# Patient Record
Sex: Female | Born: 1990
Health system: Southern US, Community
[De-identification: ages and names within clinical notes are randomized; demographics above are authoritative.]

## PROBLEM LIST (undated history)

## (undated) DIAGNOSIS — M25569 Pain in unspecified knee: Secondary | ICD-10-CM

## (undated) DIAGNOSIS — F419 Anxiety disorder, unspecified: Secondary | ICD-10-CM

## (undated) DIAGNOSIS — T8859XA Other complications of anesthesia, initial encounter: Secondary | ICD-10-CM

## (undated) DIAGNOSIS — R519 Headache, unspecified: Secondary | ICD-10-CM

## (undated) DIAGNOSIS — R51 Headache: Secondary | ICD-10-CM

## (undated) DIAGNOSIS — C73 Malignant neoplasm of thyroid gland: Secondary | ICD-10-CM

## (undated) DIAGNOSIS — K219 Gastro-esophageal reflux disease without esophagitis: Secondary | ICD-10-CM

## (undated) DIAGNOSIS — Z9889 Other specified postprocedural states: Secondary | ICD-10-CM

## (undated) DIAGNOSIS — G479 Sleep disorder, unspecified: Secondary | ICD-10-CM

## (undated) DIAGNOSIS — F988 Other specified behavioral and emotional disorders with onset usually occurring in childhood and adolescence: Secondary | ICD-10-CM

## (undated) DIAGNOSIS — R112 Nausea with vomiting, unspecified: Secondary | ICD-10-CM

## (undated) DIAGNOSIS — F32A Depression, unspecified: Secondary | ICD-10-CM

## (undated) HISTORY — DX: Other complications of anesthesia, initial encounter: T88.59XA

## (undated) HISTORY — DX: Other specified postprocedural states: Z98.890

## (undated) HISTORY — DX: Nausea with vomiting, unspecified: R11.2

---
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
## 2015-02-25 ENCOUNTER — Other Ambulatory Visit: Payer: Self-pay | Admitting: Physician Assistant

## 2015-02-25 DIAGNOSIS — R221 Localized swelling, mass and lump, neck: Secondary | ICD-10-CM

## 2015-03-08 ENCOUNTER — Ambulatory Visit
Admission: RE | Admit: 2015-03-08 | Discharge: 2015-03-08 | Disposition: A | Discharge status: Home / Self Care | Payer: 59 | Source: Ambulatory Visit | Attending: Physician Assistant | Admitting: Physician Assistant

## 2015-03-08 DIAGNOSIS — R221 Localized swelling, mass and lump, neck: Secondary | ICD-10-CM

## 2015-03-17 ENCOUNTER — Other Ambulatory Visit: Payer: Self-pay | Admitting: Physician Assistant

## 2015-03-17 DIAGNOSIS — E041 Nontoxic single thyroid nodule: Secondary | ICD-10-CM

## 2015-05-06 ENCOUNTER — Other Ambulatory Visit (HOSPITAL_COMMUNITY)
Admission: RE | Admit: 2015-05-06 | Discharge: 2015-05-06 | Disposition: A | Discharge status: Home / Self Care | Payer: 59 | Source: Ambulatory Visit | Attending: Radiology | Admitting: Radiology

## 2015-05-06 ENCOUNTER — Ambulatory Visit
Admission: RE | Admit: 2015-05-06 | Discharge: 2015-05-06 | Disposition: A | Discharge status: Home / Self Care | Payer: 59 | Source: Ambulatory Visit | Attending: Physician Assistant | Admitting: Physician Assistant

## 2015-05-06 DIAGNOSIS — E041 Nontoxic single thyroid nodule: Secondary | ICD-10-CM

## 2015-05-06 NOTE — Procedures (Signed)
Successful [place] FNA biopsy of right thyroid nodule No complications. See PACS for full report.  Ascencion Dike PA-C Interventional Radiology 05/06/2015 11:56 AM

## 2015-05-18 ENCOUNTER — Ambulatory Visit: Payer: Self-pay | Admitting: Surgery

## 2015-07-13 NOTE — Patient Instructions (Addendum)
YOUR PROCEDURE IS SCHEDULED ON :  07/19/14  REPORT TO [place] MAIN ENTRANCE FOLLOW SIGNS TO EAST ELEVATOR - GO TO 3rd FLOOR CHECK IN AT 3 EAST NURSES STATION (SHORT STAY) AT: 5:30 AM  CALL THIS NUMBER IF YOU HAVE PROBLEMS THE MORNING OF SURGERY [phone]  REMEMBER:ONLY 1 PER PERSON MAY GO TO SHORT STAY WITH YOU TO GET READY THE MORNING OF YOUR SURGERY  DO NOT EAT FOOD OR DRINK LIQUIDS AFTER MIDNIGHT  TAKE THESE MEDICINES THE MORNING OF SURGERY: NONE  STOP ASPIRIN / IBUPROFEN / ALEVE / VITAMINS / HERBAL MEDS __5__ DAYS BEFORE SURGERY  YOU MAY NOT HAVE ANY METAL ON YOUR BODY INCLUDING HAIR PINS AND PIERCING'S. DO NOT WEAR JEWELRY, MAKEUP, LOTIONS, POWDERS OR PERFUMES. DO NOT WEAR NAIL POLISH. DO NOT SHAVE 48 HRS PRIOR TO SURGERY. MEN MAY SHAVE FACE AND NECK.  DO NOT [place]. [place] IS NOT RESPONSIBLE FOR VALUABLES.  CONTACTS, DENTURES OR PARTIALS MAY NOT BE WORN TO SURGERY. LEAVE SUITCASE IN CAR. CAN BE BROUGHT TO ROOM AFTER SURGERY.  PATIENTS DISCHARGED THE DAY OF SURGERY WILL NOT BE ALLOWED TO DRIVE HOME.  PLEASE READ OVER THE FOLLOWING INSTRUCTION SHEETS _________________________________________________________________________________                                          [place] - PREPARING FOR SURGERY  Before surgery, you can play an important role.  Because skin is not sterile, your skin needs to be as free of germs as possible.  You can reduce the number of germs on your skin by washing with CHG (chlorahexidine gluconate) soap before surgery.  CHG is an antiseptic cleaner which kills germs and bonds with the skin to continue killing germs even after washing. Please DO NOT use if you have an allergy to CHG or antibacterial soaps.  If your skin becomes reddened/irritated stop using the CHG and inform your nurse when you arrive at Short Stay. Do not shave (including legs and underarms) for at least 48 hours prior to the  first CHG shower.  You may shave your face. Please follow these instructions carefully:   1.  Shower with CHG Soap the night before surgery and the  morning of Surgery.   2.  If you choose to wash your hair, wash your hair first as usual with your  normal  Shampoo.   3.  After you shampoo, rinse your hair and body thoroughly to remove the  shampoo.                                         4.  Use CHG as you would any other liquid soap.  You can apply chg directly  to the skin and wash . Gently wash with scrungie or clean wascloth    5.  Apply the CHG Soap to your body ONLY FROM THE NECK DOWN.   Do not use on open                           Wound or open sores. Avoid contact with eyes, ears mouth and genitals (private parts).  Genitals (private parts) with your normal soap.              6.  Wash thoroughly, paying special attention to the area where your surgery  will be performed.   7.  Thoroughly rinse your body with warm water from the neck down.   8.  DO NOT shower/wash with your normal soap after using and rinsing off  the CHG Soap .                9.  Pat yourself dry with a clean towel.             10.  Wear clean night clothes to bed after shower             11.  Place clean sheets on your bed the night of your first shower and do not  sleep with pets.  Day of Surgery : Do not apply any lotions/deodorants the morning of surgery.  Please wear clean clothes to the hospital/surgery center.  FAILURE TO FOLLOW THESE INSTRUCTIONS MAY RESULT IN THE CANCELLATION OF YOUR SURGERY    PATIENT SIGNATURE_________________________________  ______________________________________________________________________

## 2015-07-15 ENCOUNTER — Encounter (HOSPITAL_COMMUNITY): Payer: Self-pay

## 2015-07-15 ENCOUNTER — Encounter (HOSPITAL_COMMUNITY)
Admission: RE | Admit: 2015-07-15 | Discharge: 2015-07-15 | Disposition: A | Discharge status: Home / Self Care | Payer: BLUE CROSS/BLUE SHIELD | Source: Ambulatory Visit | Attending: Surgery | Admitting: Surgery

## 2015-07-15 ENCOUNTER — Ambulatory Visit (HOSPITAL_COMMUNITY)
Admission: RE | Admit: 2015-07-15 | Discharge: 2015-07-15 | Disposition: A | Discharge status: Home / Self Care | Payer: BLUE CROSS/BLUE SHIELD | Source: Ambulatory Visit | Attending: Anesthesiology | Admitting: Anesthesiology

## 2015-07-15 DIAGNOSIS — C73 Malignant neoplasm of thyroid gland: Secondary | ICD-10-CM

## 2015-07-15 HISTORY — DX: Sleep disorder, unspecified: G47.9

## 2015-07-15 HISTORY — DX: Pain in unspecified knee: M25.569

## 2015-07-15 HISTORY — DX: Headache: R51

## 2015-07-15 HISTORY — DX: Gastro-esophageal reflux disease without esophagitis: K21.9

## 2015-07-15 HISTORY — DX: Headache, unspecified: R51.9

## 2015-07-15 HISTORY — DX: Other specified behavioral and emotional disorders with onset usually occurring in childhood and adolescence: F98.8

## 2015-07-15 HISTORY — DX: Malignant neoplasm of thyroid gland: C73

## 2015-07-15 LAB — CBC
HEMATOCRIT: 41.8 % (ref 36.0–46.0)
Hemoglobin: 13.7 g/dL (ref 12.0–15.0)
MCH: 30.8 pg (ref 26.0–34.0)
MCHC: 32.8 g/dL (ref 30.0–36.0)
MCV: 93.9 fL (ref 78.0–100.0)
PLATELETS: 229 10*3/uL (ref 150–400)
RBC: 4.45 MIL/uL (ref 3.87–5.11)
RDW: 12.1 % (ref 11.5–15.5)
WBC: 5.5 10*3/uL (ref 4.0–10.5)

## 2015-07-15 LAB — BASIC METABOLIC PANEL
Anion gap: 7 (ref 5–15)
BUN: 12 mg/dL (ref 6–20)
CALCIUM: 9.5 mg/dL (ref 8.9–10.3)
CO2: 27 mmol/L (ref 22–32)
CREATININE: 0.58 mg/dL (ref 0.44–1.00)
Chloride: 107 mmol/L (ref 101–111)
GFR calc non Af Amer: >60 mL/min (ref >60)
GLUCOSE: 90 mg/dL (ref 65–99)
Potassium: 4.1 mmol/L (ref 3.5–5.1)
Sodium: 141 mmol/L (ref 135–145)

## 2015-07-15 LAB — HCG, SERUM, QUALITATIVE: PREG SERUM: NEGATIVE

## 2015-07-19 ENCOUNTER — Encounter (HOSPITAL_COMMUNITY): Payer: Self-pay | Admitting: Surgery

## 2015-07-19 DIAGNOSIS — C73 Malignant neoplasm of thyroid gland: Secondary | ICD-10-CM | POA: Diagnosis present

## 2015-07-19 NOTE — H&P (Signed)
General Surgery [facility]  Donna Mueller DOB: August 10, 1990 Single / Language: Donna Mueller / Race: White Female  History of Present Illness   Patient referred by Donna Grove, PA-C, for evaluation of newly diagnosed papillary thyroid carcinoma. Patient presents with a history of a lump in the right neck which has been present for approximately one year. Patient first noted this after an upper respiratory infection. Over the past 3-4 months she has noted some mild discomfort and slight increase in size. She presented to her primary care provider for evaluation. Patient was referred for ultrasound of the neck which was performed on March 08, 2015. This showed a mildly enlarged thyroid gland containing a solitary nodule in the right upper pole measuring 3.5 x 1.8 x 1.9 cm. It contained central punctate calcifications. Patient subsequently underwent ultrasound-guided fine-needle aspiration biopsy on May 06, 2015. Cytopathology shows suspicion of papillary thyroid carcinoma, Bethesda category V. patient is now referred for surgical assessment. She is also scheduled to see endocrinology on May 31, 2015, Dr. Delrae Mueller. Patient has no prior history of thyroid disease. She has no history of head or neck surgery. There is a family history of hypothyroidism and a paternal grandmother. There is no family history of other endocrine neoplasms.  Past Surgical History No pertinent past surgical history  Diagnostic Studies History  Colonoscopy never Mammogram never Pap Smear 1-5 years ago  Allergies No Known Drug Allergies11/02/2015  Medication History Minastrin 24 Fe (1-20MG -MCG(24) Tablet Chewable, Oral) Active. Medications Reconciled  Social History Alcohol use Occasional alcohol use. Caffeine use Carbonated beverages, Coffee, Tea. No drug use Tobacco use Never smoker.  Family History Hypertension Father. Migraine Headache  Mother.  Pregnancy / Birth History Age at menarche 61 years. Contraceptive History Oral contraceptives. Gravida 0 Para 0 Regular periods  Review of Systems General Present- Fatigue, Night Sweats and Weight Loss. Not Present- Appetite Loss, Chills, Fever and Weight Gain. HEENT Present- Seasonal Allergies and Wears glasses/contact lenses. Not Present- Earache, Hearing Loss, Hoarseness, Nose Bleed, Oral Ulcers, Ringing in the Ears, Sinus Pain, Sore Throat, Visual Disturbances and Yellow Eyes. Respiratory Not Present- Bloody sputum, Chronic Cough, Difficulty Breathing, Snoring and Wheezing. Breast Not Present- Breast Mass, Breast Pain, Nipple Discharge and Skin Changes. Cardiovascular Present- Rapid Heart Rate. Not Present- Chest Pain, Difficulty Breathing Lying Down, Leg Cramps, Palpitations, Shortness of Breath and Swelling of Extremities. Gastrointestinal Present- Bloating, Gets full quickly at meals and Indigestion. Not Present- Abdominal Pain, Bloody Stool, Change in Bowel Habits, Chronic diarrhea, Constipation, Difficulty Swallowing, Excessive gas, Hemorrhoids, Nausea, Rectal Pain and Vomiting. Female Genitourinary Not Present- Frequency, Nocturia, Painful Urination, Pelvic Pain and Urgency. Musculoskeletal Present- Joint Pain, Muscle Pain, Muscle Weakness and Swelling of Extremities. Not Present- Back Pain and Joint Stiffness. Neurological Present- Headaches. Not Present- Decreased Memory, Fainting, Numbness, Seizures, Tingling, Tremor, Trouble walking and Weakness. Psychiatric Present- Anxiety. Not Present- Bipolar, Change in Sleep Pattern, Depression, Fearful and Frequent crying. Endocrine Present- Excessive Hunger, Hair Changes and Hot flashes. Not Present- Cold Intolerance, Heat Intolerance and New Diabetes. Hematology Not Present- Easy Bruising, Excessive bleeding, Gland problems, HIV and Persistent Infections.  Vitals Weight: 113 lb Height: 62in Body Surface Area: 1.5 m  Body Mass Index: 20.67 kg/m  Temp.: 97.51F(Temporal)  Pulse: 81 (Regular)  BP: 126/70 (Sitting, Left Arm, Standard)  Physical Exam  General - appears comfortable, no distress; not diaphorectic  HEENT - normocephalic; sclerae clear, gaze conjugate; mucous membranes moist, dentition good; voice normal  Neck - asymmetric on extension; no  palpable anterior or posterior cervical adenopathy; smooth mobile and mildly tender 4 cm mass right upper thyroid lobe, mobile with swallowing; no palpable lymphadenopathy in the supraclavicular fossa bilaterally or in the posterior triangles bilaterally  Chest - clear bilaterally without rhonchi, rales, or wheeze  Cor - regular rhythm with normal rate; no significant murmur  Ext - non-tender without significant edema or lymphedema  Neuro - grossly intact; no tremor  Assessment & Plan  PAPILLARY THYROID CARCINOMA (C73)  Patient presents with a large solitary mass in the right upper pole of the thyroid consistent with papillary thyroid carcinoma on fine-needle aspiration biopsy. Written literature on thyroid surgery is provided to the patient and her family for review at home.  I have recommended total thyroidectomy with limited lymph node dissection for management of papillary thyroid carcinoma measuring 3.5 cm in diameter. We have discussed the risk and benefits of the procedure including the potential for recurrent laryngeal nerve injury and injury to parathyroid glands. We discussed the hospital stay to be anticipated. We have discussed the potential need for radioactive iodine treatment. We have discussed the need for lifelong thyroid hormone replacement therapy.  Patient is scheduled to see Dr. Dagmar Mueller in consultation in 2 weeks. We will anticipate surgery shortly after the Thanksgiving holiday.  The risks and benefits of the procedure have been discussed at length with the patient. The patient understands the proposed procedure, potential  alternative treatments, and the course of recovery to be expected. All of the patient's questions have been answered at this time. The patient wishes to proceed with surgery.  Donna Regal, MD, [facility] Office: [phone]

## 2015-07-20 ENCOUNTER — Observation Stay (HOSPITAL_COMMUNITY)
Admission: RE | Admit: 2015-07-20 | Discharge: 2015-07-21 | Disposition: A | Discharge status: Home / Self Care | Payer: BLUE CROSS/BLUE SHIELD | Source: Ambulatory Visit | Attending: Surgery | Admitting: Surgery

## 2015-07-20 ENCOUNTER — Encounter (HOSPITAL_COMMUNITY): Payer: Self-pay | Admitting: *Deleted

## 2015-07-20 ENCOUNTER — Encounter (HOSPITAL_COMMUNITY)
Admission: RE | Disposition: A | Discharge status: Home / Self Care | Payer: Self-pay | Source: Ambulatory Visit | Attending: Surgery

## 2015-07-20 ENCOUNTER — Ambulatory Visit (HOSPITAL_COMMUNITY): Payer: BLUE CROSS/BLUE SHIELD | Admitting: Anesthesiology

## 2015-07-20 DIAGNOSIS — C770 Secondary and unspecified malignant neoplasm of lymph nodes of head, face and neck: Secondary | ICD-10-CM | POA: Insufficient documentation

## 2015-07-20 DIAGNOSIS — K219 Gastro-esophageal reflux disease without esophagitis: Secondary | ICD-10-CM | POA: Diagnosis not present

## 2015-07-20 DIAGNOSIS — C73 Malignant neoplasm of thyroid gland: Secondary | ICD-10-CM | POA: Diagnosis present

## 2015-07-20 HISTORY — PX: LYMPH NODE DISSECTION: SHX5087

## 2015-07-20 HISTORY — PX: THYROIDECTOMY: SHX17

## 2015-07-20 SURGERY — THYROIDECTOMY
Anesthesia: General | Site: Neck

## 2015-07-20 MED ORDER — MIDAZOLAM HCL 2 MG/2ML IJ SOLN
INTRAMUSCULAR | Status: AC
  Filled 2015-07-20: qty 2

## 2015-07-20 MED ORDER — HYDROMORPHONE HCL 1 MG/ML IJ SOLN
1.0000 mg | INTRAMUSCULAR | Status: DC | PRN
  Administered 2015-07-20: 1 mg via INTRAVENOUS
  Administered 2015-07-20: 0.5 mg via INTRAVENOUS
  Administered 2015-07-20 (×2): 1 mg via INTRAVENOUS
  Filled 2015-07-20 (×3): qty 1

## 2015-07-20 MED ORDER — FENTANYL CITRATE (PF) 100 MCG/2ML IJ SOLN
INTRAMUSCULAR | Status: AC
  Filled 2015-07-20: qty 2

## 2015-07-20 MED ORDER — SUGAMMADEX SODIUM 200 MG/2ML IV SOLN
INTRAVENOUS | Status: DC | PRN
  Administered 2015-07-20: 125 mg via INTRAVENOUS

## 2015-07-20 MED ORDER — ACETAMINOPHEN 325 MG PO TABS: 650.0000 mg | ORAL_TABLET | Freq: Four times a day (QID) | ORAL | Status: DC | PRN

## 2015-07-20 MED ORDER — FENTANYL CITRATE (PF) 100 MCG/2ML IJ SOLN
25.0000 ug | INTRAMUSCULAR | Status: DC | PRN
  Administered 2015-07-20 (×3): 50 ug via INTRAVENOUS

## 2015-07-20 MED ORDER — PROPOFOL 10 MG/ML IV BOLUS
INTRAVENOUS | Status: AC
  Filled 2015-07-20: qty 20

## 2015-07-20 MED ORDER — PROMETHAZINE HCL 25 MG/ML IJ SOLN
6.2500 mg | INTRAMUSCULAR | Status: DC | PRN
  Administered 2015-07-20: 6.25 mg via INTRAVENOUS

## 2015-07-20 MED ORDER — KCL IN DEXTROSE-NACL 20-5-0.45 MEQ/L-%-% IV SOLN
INTRAVENOUS | Status: DC
  Administered 2015-07-20: 14:00:00 via INTRAVENOUS
  Filled 2015-07-20 (×2): qty 1000

## 2015-07-20 MED ORDER — 0.9 % SODIUM CHLORIDE (POUR BTL) OPTIME
TOPICAL | Status: DC | PRN
  Administered 2015-07-20: 1000 mL

## 2015-07-20 MED ORDER — ACETAMINOPHEN 650 MG RE SUPP: 650.0000 mg | Freq: Four times a day (QID) | RECTAL | Status: DC | PRN

## 2015-07-20 MED ORDER — ROCURONIUM BROMIDE 100 MG/10ML IV SOLN
INTRAVENOUS | Status: DC | PRN
  Administered 2015-07-20: 10 mg via INTRAVENOUS
  Administered 2015-07-20: 40 mg via INTRAVENOUS

## 2015-07-20 MED ORDER — ONDANSETRON HCL 4 MG/2ML IJ SOLN
INTRAMUSCULAR | Status: DC | PRN
  Administered 2015-07-20: 4 mg via INTRAVENOUS

## 2015-07-20 MED ORDER — HYDROCODONE-ACETAMINOPHEN 5-325 MG PO TABS
1.0000 | ORAL_TABLET | ORAL | Status: DC | PRN
Start: 2015-07-20 — End: 2015-07-21
  Administered 2015-07-21: 2 via ORAL
  Administered 2015-07-21: 1 via ORAL
  Filled 2015-07-20 (×2): qty 2

## 2015-07-20 MED ORDER — LACTATED RINGERS IV SOLN
INTRAVENOUS | Status: DC
  Administered 2015-07-20: 1000 mL via INTRAVENOUS

## 2015-07-20 MED ORDER — ONDANSETRON HCL 4 MG/2ML IJ SOLN: 4.0000 mg | Freq: Four times a day (QID) | INTRAMUSCULAR | Status: DC | PRN

## 2015-07-20 MED ORDER — SCOPOLAMINE 1 MG/3DAYS TD PT72
MEDICATED_PATCH | TRANSDERMAL | Status: DC | PRN
  Administered 2015-07-20: 1 via TRANSDERMAL

## 2015-07-20 MED ORDER — ONDANSETRON 4 MG PO TBDP: 4.0000 mg | ORAL_TABLET | Freq: Four times a day (QID) | ORAL | Status: DC | PRN

## 2015-07-20 MED ORDER — PROPOFOL 10 MG/ML IV BOLUS
INTRAVENOUS | Status: DC | PRN
  Administered 2015-07-20: 160 mg via INTRAVENOUS

## 2015-07-20 MED ORDER — CEFAZOLIN SODIUM-DEXTROSE 2-3 GM-% IV SOLR
INTRAVENOUS | Status: AC
  Filled 2015-07-20: qty 50

## 2015-07-20 MED ORDER — LACTATED RINGERS IV SOLN
INTRAVENOUS | Status: DC | PRN
  Administered 2015-07-20 (×2): via INTRAVENOUS

## 2015-07-20 MED ORDER — BUPIVACAINE-EPINEPHRINE 0.25% -1:200000 IJ SOLN
INTRAMUSCULAR | Status: DC | PRN
  Administered 2015-07-20: 50 mL

## 2015-07-20 MED ORDER — PROMETHAZINE HCL 25 MG/ML IJ SOLN
INTRAMUSCULAR | Status: AC
  Filled 2015-07-20: qty 1

## 2015-07-20 MED ORDER — DEXAMETHASONE SODIUM PHOSPHATE 10 MG/ML IJ SOLN
INTRAMUSCULAR | Status: DC | PRN
  Administered 2015-07-20: 10 mg via INTRAVENOUS

## 2015-07-20 MED ORDER — BUPIVACAINE-EPINEPHRINE 0.25% -1:200000 IJ SOLN
INTRAMUSCULAR | Status: AC
  Filled 2015-07-20: qty 1

## 2015-07-20 MED ORDER — LIDOCAINE HCL (CARDIAC) 20 MG/ML IV SOLN
INTRAVENOUS | Status: DC | PRN
  Administered 2015-07-20: 50 mg via INTRAVENOUS

## 2015-07-20 MED ORDER — LIDOCAINE HCL (CARDIAC) 20 MG/ML IV SOLN
INTRAVENOUS | Status: AC
  Filled 2015-07-20: qty 5

## 2015-07-20 MED ORDER — DEXAMETHASONE SODIUM PHOSPHATE 10 MG/ML IJ SOLN
INTRAMUSCULAR | Status: AC
  Filled 2015-07-20: qty 1

## 2015-07-20 MED ORDER — ONDANSETRON HCL 4 MG/2ML IJ SOLN
INTRAMUSCULAR | Status: AC
  Filled 2015-07-20: qty 2

## 2015-07-20 MED ORDER — ROCURONIUM BROMIDE 100 MG/10ML IV SOLN
INTRAVENOUS | Status: AC
  Filled 2015-07-20: qty 1

## 2015-07-20 MED ORDER — FENTANYL CITRATE (PF) 250 MCG/5ML IJ SOLN
INTRAMUSCULAR | Status: AC
  Filled 2015-07-20: qty 5

## 2015-07-20 MED ORDER — CALCIUM CARBONATE 1250 (500 CA) MG PO TABS
2.0000 | ORAL_TABLET | Freq: Three times a day (TID) | ORAL | Status: DC
  Administered 2015-07-20 – 2015-07-21 (×3): 1000 mg via ORAL
  Filled 2015-07-20 (×5): qty 2

## 2015-07-20 MED ORDER — SCOPOLAMINE 1 MG/3DAYS TD PT72
MEDICATED_PATCH | TRANSDERMAL | Status: AC
  Filled 2015-07-20: qty 1

## 2015-07-20 MED ORDER — MIDAZOLAM HCL 5 MG/5ML IJ SOLN
INTRAMUSCULAR | Status: DC | PRN
  Administered 2015-07-20: 2 mg via INTRAVENOUS

## 2015-07-20 MED ORDER — CEFAZOLIN SODIUM-DEXTROSE 2-3 GM-% IV SOLR
2.0000 g | INTRAVENOUS | Status: AC
  Administered 2015-07-20: 2 g via INTRAVENOUS

## 2015-07-20 MED ORDER — HYDROMORPHONE HCL 1 MG/ML IJ SOLN
INTRAMUSCULAR | Status: AC
  Filled 2015-07-20: qty 1

## 2015-07-20 MED ORDER — FENTANYL CITRATE (PF) 100 MCG/2ML IJ SOLN
INTRAMUSCULAR | Status: DC | PRN
  Administered 2015-07-20: 50 ug via INTRAVENOUS
  Administered 2015-07-20: 100 ug via INTRAVENOUS
  Administered 2015-07-20 (×2): 50 ug via INTRAVENOUS

## 2015-07-20 SURGICAL SUPPLY — 68 items
APL SKNCLS STERI-STRIP NONHPOA (GAUZE/BANDAGES/DRESSINGS) ×2
ATTRACTOMAT 16X20 MAGNETIC DRP (DRAPES) ×4 IMPLANT
BENZOIN TINCTURE PRP APPL 2/3 (GAUZE/BANDAGES/DRESSINGS) ×2 IMPLANT
BLADE HEX COATED 2.75 (ELECTRODE) ×4 IMPLANT
BLADE SURG 10 STRL SS (BLADE) IMPLANT
BLADE SURG 15 STRL LF DISP TIS (BLADE) ×2 IMPLANT
BLADE SURG 15 STRL SS (BLADE) ×4
CANISTER SUCTION 1200CC (MISCELLANEOUS) IMPLANT
CHLORAPREP W/TINT 26ML (MISCELLANEOUS) ×4 IMPLANT
CLIP TI MEDIUM 6 (CLIP) ×10 IMPLANT
CLIP TI WIDE RED SMALL 6 (CLIP) ×10 IMPLANT
CLOSURE WOUND 1/2 X4 (GAUZE/BANDAGES/DRESSINGS) ×1
COVER MAYO STAND STRL (DRAPES) ×2 IMPLANT
COVER SURGICAL LIGHT HANDLE (MISCELLANEOUS) ×4 IMPLANT
COVER TABLE BACK 60X90 (DRAPES) ×4 IMPLANT
DISSECTOR ROUND CHERRY 3/8 STR (MISCELLANEOUS) IMPLANT
DRAPE LAPAROTOMY T 98X78 PEDS (DRAPES) ×4 IMPLANT
DRAPE UTILITY XL STRL (DRAPES) ×4 IMPLANT
DRESSING SURGICEL FIBRLLR 1X2 (HEMOSTASIS) ×2 IMPLANT
DRSG SURGICEL FIBRILLAR 1X2 (HEMOSTASIS) ×4
ELECT COATED BLADE 2.86 ST (ELECTRODE) ×4 IMPLANT
ELECT PENCIL ROCKER SW 15FT (MISCELLANEOUS) ×4 IMPLANT
ELECT REM PT RETURN 9FT ADLT (ELECTROSURGICAL) ×4
ELECTRODE REM PT RTRN 9FT ADLT (ELECTROSURGICAL) ×2 IMPLANT
GAUZE SPONGE 4X4 12PLY STRL (GAUZE/BANDAGES/DRESSINGS) ×2 IMPLANT
GAUZE SPONGE 4X4 16PLY XRAY LF (GAUZE/BANDAGES/DRESSINGS) ×4 IMPLANT
GLOVE BIOGEL PI IND STRL 8 (GLOVE) ×2 IMPLANT
GLOVE BIOGEL PI INDICATOR 8 (GLOVE) ×2
GLOVE SURG ORTHO 8.0 STRL STRW (GLOVE) ×4 IMPLANT
GOWN STRL REUS W/ TWL XL LVL3 (GOWN DISPOSABLE) ×2 IMPLANT
GOWN STRL REUS W/TWL 2XL LVL3 (GOWN DISPOSABLE) ×2 IMPLANT
GOWN STRL REUS W/TWL XL LVL3 (GOWN DISPOSABLE) ×16 IMPLANT
KIT BASIN OR (CUSTOM PROCEDURE TRAY) ×4 IMPLANT
LIQUID BAND (GAUZE/BANDAGES/DRESSINGS) ×2 IMPLANT
NDL HYPO 25X1 1.5 SAFETY (NEEDLE) ×2 IMPLANT
NEEDLE HYPO 25X1 1.5 SAFETY (NEEDLE) ×4 IMPLANT
NS IRRIG 1000ML POUR BTL (IV SOLUTION) ×4 IMPLANT
PACK BASIC VI WITH GOWN DISP (CUSTOM PROCEDURE TRAY) ×4 IMPLANT
PACK BASIN DAY SURGERY FS (CUSTOM PROCEDURE TRAY) ×4 IMPLANT
SHEARS HARMONIC 9CM CVD (BLADE) ×4 IMPLANT
SLEEVE SCD COMPRESS KNEE MED (MISCELLANEOUS) IMPLANT
STAPLER VISISTAT 35W (STAPLE) IMPLANT
STRIP CLOSURE SKIN 1/2X4 (GAUZE/BANDAGES/DRESSINGS) ×3 IMPLANT
SUT MNCRL AB 4-0 PS2 18 (SUTURE) ×4 IMPLANT
SUT MON AB 3-0 SH 27 (SUTURE)
SUT MON AB 3-0 SH27 (SUTURE) IMPLANT
SUT MON AB 5-0 PS2 18 (SUTURE) ×2 IMPLANT
SUT PROLENE 3 0 SH 48 (SUTURE) ×2 IMPLANT
SUT SILK 2 0 (SUTURE)
SUT SILK 2-0 18XBRD TIE 12 (SUTURE) IMPLANT
SUT SILK 3 0 (SUTURE)
SUT SILK 3 0 SH 30 (SUTURE) IMPLANT
SUT SILK 3-0 18XBRD TIE 12 (SUTURE) IMPLANT
SUT VIC AB 3-0 SH 18 (SUTURE) ×8 IMPLANT
SUT VIC AB 3-0 SH 27 (SUTURE) ×4
SUT VIC AB 3-0 SH 27X BRD (SUTURE) ×2 IMPLANT
SUT VIC AB 4-0 BRD 54 (SUTURE) IMPLANT
SYR BULB 3OZ (MISCELLANEOUS) IMPLANT
SYR BULB IRRIGATION 50ML (SYRINGE) ×4 IMPLANT
SYR CONTROL 10ML LL (SYRINGE) ×4 IMPLANT
TAPE CLOTH SURG 4X10 WHT LF (GAUZE/BANDAGES/DRESSINGS) ×2 IMPLANT
TOWEL OR 17X24 6PK STRL BLUE (TOWEL DISPOSABLE) ×4 IMPLANT
TOWEL OR 17X26 10 PK STRL BLUE (TOWEL DISPOSABLE) ×4 IMPLANT
TOWEL OR NON WOVEN STRL DISP B (DISPOSABLE) ×4 IMPLANT
TUBE CONNECTING 20'X1/4 (TUBING)
TUBE CONNECTING 20X1/4 (TUBING) IMPLANT
YANKAUER SUCT BULB TIP 10FT TU (MISCELLANEOUS) ×4 IMPLANT
YANKAUER SUCT BULB TIP NO VENT (SUCTIONS) ×2 IMPLANT

## 2015-07-20 NOTE — Brief Op Note (Signed)
07/20/2015  9:27 AM  PATIENT:  Donna Mueller  25 y.o. female  PRE-OPERATIVE DIAGNOSIS:  PAPILLARY THYROID CARCINOMA  POST-OPERATIVE DIAGNOSIS:  PAPILLARY THYROID CARCINOMA  PROCEDURE:  Procedure(s): TOTAL THYROIDECTOMY WITH LIMITED CENTRAL COMPARTMENT  LYMPH NODE DISSECTION  AND AUTO-TRANSPLANTATION OF LEFT INFERIOR PARATHYROID  (N/A) LYMPH NODE DISSECTION (N/A)  SURGEON:  Surgeon(s) and Role:    * Armandina Gemma, MD - Primary  ANESTHESIA:   general  EBL:  Total I/O In: 1000 [I.V.:1000] Out: -   BLOOD ADMINISTERED:none  DRAINS: none   LOCAL MEDICATIONS USED:  NONE  SPECIMEN:  Excision  DISPOSITION OF SPECIMEN:  PATHOLOGY  COUNTS:  YES  TOURNIQUET:  * No tourniquets in log *  DICTATION: .Other Dictation: Dictation Number [id number]  PLAN OF CARE: Admit for overnight observation  PATIENT DISPOSITION:  PACU - hemodynamically stable.   Delay start of Pharmacological VTE agent (>24hrs) due to surgical blood loss or risk of bleeding: yes  Earnstine Regal, MD, [facility] Office: [phone]

## 2015-07-20 NOTE — Anesthesia Preprocedure Evaluation (Addendum)
Anesthesia Evaluation  Patient identified by MRN, date of birth, ID band Patient awake    Reviewed: Allergy & Precautions, NPO status , Patient's Chart, lab work & pertinent test results  Airway Mallampati: I  TM Distance: >3 FB Neck ROM: Full    Dental  (+) Teeth Intact, Dental Advisory Given   Pulmonary neg pulmonary ROS,    Pulmonary exam normal breath sounds clear to auscultation       Cardiovascular Exercise Tolerance: Good (-) hypertensionnegative cardio ROS Normal cardiovascular exam Rhythm:Regular Rate:Normal     Neuro/Psych  Headaches, negative psych ROS   GI/Hepatic Neg liver ROS, GERD  ,  Endo/Other  negative endocrine ROS  Renal/GU negative Renal ROS     Musculoskeletal negative musculoskeletal ROS (+)   Abdominal   Peds  (+) ATTENTION DEFICIT DISORDER WITHOUT HYPERACTIVITY Hematology negative hematology ROS (+)   Anesthesia Other Findings Day of surgery medications reviewed with the patient.  Papillary thyroid carcinoma: mildly enlarged thyroid gland containing a solitary nodule in the right upper pole measuring 3.5 x 1.8 x 1.9 cm  Reproductive/Obstetrics negative OB ROS                          Anesthesia Physical Anesthesia Plan  ASA: II  Anesthesia Plan: General   Post-op Pain Management:    Induction: Intravenous  Airway Management Planned: Oral ETT  Additional Equipment:   Intra-op Plan:   Post-operative Plan: Extubation in OR  Informed Consent: I have reviewed the patients History and Physical, chart, labs and discussed the procedure including the risks, benefits and alternatives for the proposed anesthesia with the patient or authorized representative who has indicated his/her understanding and acceptance.   Dental advisory given  Plan Discussed with: CRNA  Anesthesia Plan Comments: (Risks/benefits of general anesthesia discussed with patient including  risk of damage to teeth, lips, gum, and tongue, nausea/vomiting, allergic reactions to medications, and the possibility of heart attack, stroke and death.  All patient questions answered.  Patient wishes to proceed.  2 PIV)       Anesthesia Quick Evaluation

## 2015-07-20 NOTE — Anesthesia Postprocedure Evaluation (Signed)
Anesthesia Post Note  Patient: Donna Mueller  Procedure(s) Performed: Procedure(s) (LRB): TOTAL THYROIDECTOMY WITH LIMITED CENTRAL COMPARTMENT  LYMPH NODE DISSECTION  AND AUTO-TRANSPLANTATION OF LEFT INFERIOR PARATHYROID  (N/A) LYMPH NODE DISSECTION (N/A)  Patient location during evaluation: PACU Anesthesia Type: General Level of consciousness: awake and alert Pain management: pain level controlled Vital Signs Assessment: post-procedure vital signs reviewed and stable Respiratory status: spontaneous breathing, nonlabored ventilation, respiratory function stable and patient connected to nasal cannula oxygen Cardiovascular status: blood pressure returned to baseline and stable Postop Assessment: no signs of nausea or vomiting Anesthetic complications: no    Last Vitals:  Filed Vitals:   07/20/15 1015 07/20/15 1030  BP: 119/73 104/87  Pulse: 90 86  Temp:  36.9 C  Resp: 19 18    Last Pain:  Filed Vitals:   07/20/15 1041  PainSc: Donna Mueller

## 2015-07-20 NOTE — Transfer of Care (Signed)
Immediate Anesthesia Transfer of Care Note  Patient: Donna Mueller  Procedure(s) Performed: Procedure(s): TOTAL THYROIDECTOMY WITH LIMITED CENTRAL COMPARTMENT  LYMPH NODE DISSECTION  AND AUTO-TRANSPLANTATION OF LEFT INFERIOR PARATHYROID  (N/A) LYMPH NODE DISSECTION (N/A)  Patient Location: PACU  Anesthesia Type:General  Level of Consciousness: awake, alert  and oriented  Airway & Oxygen Therapy: Patient Spontanous Breathing and Patient connected to face mask oxygen  Post-op Assessment: Report given to RN and Post -op Vital signs reviewed and stable  Post vital signs: Reviewed and stable  Last Vitals:  Filed Vitals:   07/20/15 0543  BP: 119/73  Pulse: 109  Temp: 36.6 C  Resp: 18    Complications: No apparent anesthesia complications

## 2015-07-20 NOTE — Anesthesia Procedure Notes (Signed)
Procedure Name: Intubation Date/Time: 07/20/2015 7:33 AM Performed by: Noralyn Pick D Pre-anesthesia Checklist: Patient identified, Emergency Drugs available, Suction available and Patient being monitored Patient Re-evaluated:Patient Re-evaluated prior to inductionOxygen Delivery Method: Circle System Utilized Preoxygenation: Pre-oxygenation with 100% oxygen Intubation Type: IV induction Ventilation: Mask ventilation without difficulty Laryngoscope Size: Mac and 3 Grade View: Grade I Tube type: Oral Tube size: 7.5 mm Number of attempts: 1 Airway Equipment and Method: Stylet and Oral airway Placement Confirmation: ETT inserted through vocal cords under direct vision,  positive ETCO2 and breath sounds checked- equal and bilateral Secured at: 21 cm Tube secured with: Tape Dental Injury: Teeth and Oropharynx as per pre-operative assessment

## 2015-07-20 NOTE — Interval H&P Note (Signed)
History and Physical Interval Note:  07/20/2015 7:11 AM  Donna Mueller  has presented today for surgery, with the diagnosis of PAPILLARY THYROID CARCINOMA.  The various methods of treatment have been discussed with the patient and family. After consideration of risks, benefits and other options for treatment, the patient has consented to    Procedure(s): TOTAL THYROIDECTOMY WITH LIMITED LYMPH NODE DISSECTION (N/A) LYMPH NODE DISSECTION (N/A) as a surgical intervention .    The patient's history has been reviewed, patient examined, no change in status, stable for surgery.  I have reviewed the patient's chart and labs.  Questions were answered to the patient's satisfaction.    Earnstine Regal, MD, [facility] Office: [place]

## 2015-07-20 NOTE — Op Note (Signed)
NAMEMarland Kitchen  Donna Mueller, Donna Mueller NO.:  [id number]  MEDICAL RECORD NO.:  [id number]  LOCATION:  83                         FACILITY:  [facility]  PHYSICIAN:  Earnstine Regal, MD      DATE OF BIRTH:  02-17-1991  DATE OF PROCEDURE:  07/20/2015                              OPERATIVE REPORT   PREOPERATIVE DIAGNOSIS:  Papillary thyroid carcinoma.  POSTOPERATIVE DIAGNOSIS:  Papillary thyroid carcinoma.  PROCEDURE: 1. Total thyroidectomy. 2. Limited central compartment lymph node dissection. 3. Autotransplantation of left inferior parathyroid gland to left     sternocleidomastoid muscle.  SURGEON:  Earnstine Regal, M.D.  ANESTHESIA:  General.  ESTIMATED BLOOD LOSS:  Minimal.  PREPARATION:  ChloraPrep.  COMPLICATIONS:  None.  INDICATIONS:  The patient is a 25 year old female, referred by her primary care provider for newly diagnosed papillary thyroid carcinoma. The patient had presented with a mass in the right neck, which had been present for approximately 1 year.  She had first noted this following an upper respiratory infection.  The mass had increased in size and caused minor discomfort.  She was evaluated with an ultrasound examination in August 2016, showing a 3.5 cm mass in the upper pole of the right thyroid lobe.  There were central punctate calcifications.  The patient underwent ultrasound-guided fine-needle aspiration biopsy in October 2016, with cytopathology showing suspicion of papillary thyroid carcinoma, Bethesda category V.  The patient has been evaluated by Dr. Delrae Rend, from Endocrinology.  She now comes to Surgery for thyroidectomy.  BODY OF REPORT:  Procedure was done in OR #2 at the [facility].  The patient was brought to the operating room, placed in supine position on the operating room table.  Following administration of general anesthesia, the patient was positioned and then prepped and draped in the usual aseptic fashion.   After ascertaining that an adequate level of anesthesia had been achieved, a Kocher incision was made with #15 blade.  Dissection was carried through subcutaneous tissues and platysma, and hemostasis achieved with the electrocautery.  Skin flaps were elevated cephalad and caudad, and a Weitlaner retractor was placed for exposure.  Strap muscles were incised in the midline.  Dissection was begun on the left side.  Strap muscles were reflected laterally exposing a normal-sized left thyroid lobe. There were no obvious nodules.  Left lobe was gently mobilized. Superior pole vessels were dissected out and divided between small and medium Ligaclips with the Harmonic scalpel.  Superior parathyroid gland was identified and preserved.  Middle thyroid vein was divided between Ligaclips with the Harmonic scalpel.  Gland was rolled anteriorly. Inferior venous tributaries were divided between Ligaclips.  The gland was rolled further anteriorly onto the trachea and the branches of the inferior thyroid artery were divided between small Ligaclips.  Recurrent laryngeal nerve was identified and preserved.  Ligament of Gwenlyn Found was released with the electrocautery and the gland was mobilized onto the anterior trachea.  There was a moderate-sized pyramidal lobe, which was dissected off the anterior thyroid cartilage and resected en bloc with the thyroid isthmus.  Dry pack was placed in the left neck.  Next, we turned our attention to the right  thyroid lobe.  Strap muscles were reflected laterally.  Right lobe was moderately enlarged.  There was a large firm mass present in the superior pole.  Gland was gently mobilized.  Middle thyroid vein was divided between Ligaclips.  Superior pole was dissected out and muscular structures were dissected off the capsule of the thyroid gland.  Superior vessels were divided individually between small and medium Ligaclips with the Harmonic scalpel.  Superior parathyroid was  identified and preserved.  Remainder of the superior pole vessels were divided and superior pole was dissected downwards.  Inferior venous tributaries were divided between Ligaclips.  Gland was rolled anteriorly.  Recurrent laryngeal nerve was identified and preserved.  Branches of the inferior thyroid artery were divided between small Ligaclips.  Ligament of Gwenlyn Found was released with the electrocautery and the gland was mobilized onto the anterior trachea from which it was fully resected with the electrocautery.  Suture was used to mark the right thyroid lobe.  Inspection of the thyroid revealed parathyroid tissue adherent to the left inferior pole.  This tissue was dissected off the capsule of the thyroid.  It was placed in saline.  It was sectioned into multiple tiny pieces.  An incision was made in the fascia overlying the left sternocleidomastoid muscle.  A muscular pocket was created and the parathyroid tissue was inserted into the muscular pocket.  Overlying fascia was closed with a 3-0 Prolene figure-of-eight suture.  The total thyroid was then submitted to Pathology for permanent review.  Neck was irrigated bilaterally and good hemostasis was noted.  Next, the central compartment lymph nodes overlying the lower trachea were dissected out using the electrocautery and small Ligaclips for hemostasis.  What appeared to be several small subcentimeter nodes were resected and submitted as a separate specimen to Pathology, labeled limited central compartment lymph node dissection.  Good hemostasis was obtained throughout the operative field.  Fibrillar was placed throughout the operative field.  Strap muscles were reapproximated in the midline with interrupted 3-0 Vicryl sutures. Platysma was closed with interrupted 3-0 Vicryl sutures.  Skin was closed with a running 4-0 Monocryl subcuticular suture.  Wound was washed and dried, and benzoin and Steri-Strips were applied.   Sterile dressings were applied.  The patient was awakened from anesthesia and brought to the recovery room.  The patient tolerated the procedure well.   Earnstine Regal, MD, [facility] Office: [phone]   TMG/MEDQ  D:  07/20/2015  T:  07/20/2015  Job:  [id number]  cc:   Suella Grove, PA-C  Katina Degree, M.D. Fax: [phone]

## 2015-07-21 DIAGNOSIS — C73 Malignant neoplasm of thyroid gland: Secondary | ICD-10-CM | POA: Diagnosis not present

## 2015-07-21 LAB — BASIC METABOLIC PANEL
ANION GAP: 8 (ref 5–15)
BUN: 5 mg/dL — ABNORMAL LOW (ref 6–20)
CHLORIDE: 105 mmol/L (ref 101–111)
CO2: 27 mmol/L (ref 22–32)
Calcium: 8.1 mg/dL — ABNORMAL LOW (ref 8.9–10.3)
Creatinine, Ser: 0.49 mg/dL (ref 0.44–1.00)
Glucose, Bld: 133 mg/dL — ABNORMAL HIGH (ref 65–99)
POTASSIUM: 4.1 mmol/L (ref 3.5–5.1)
SODIUM: 140 mmol/L (ref 135–145)

## 2015-07-21 MED ORDER — CALCIUM CARBONATE 1250 (500 CA) MG PO TABS: 2.0000 | ORAL_TABLET | Freq: Three times a day (TID) | ORAL | Status: DC

## 2015-07-21 MED ORDER — HYDROCODONE-ACETAMINOPHEN 5-325 MG PO TABS: 1.0000 | ORAL_TABLET | ORAL | Status: DC | PRN

## 2015-07-21 MED ORDER — SODIUM CHLORIDE 0.9 % IV SOLN
2.0000 g | INTRAVENOUS | Status: AC
  Administered 2015-07-21: 2 g via INTRAVENOUS
  Filled 2015-07-21: qty 20

## 2015-07-21 NOTE — Progress Notes (Signed)
Donna Mueller to be D/C'd Home per MD order.  Discussed prescriptions and follow up appointments with the patient. Prescriptions given to patient, medication list explained in detail. Pt verbalized understanding.    Medication List    TAKE these medications        acetaminophen 500 MG tablet  Commonly known as:  TYLENOL  Take 500 mg by mouth every 6 (six) hours as needed.     calcium carbonate 1250 (500 Ca) MG tablet  Commonly known as:  OS-CAL - dosed in mg of elemental calcium  Take 2 tablets (1,000 mg of elemental calcium total) by mouth 3 (three) times daily with meals.     HYDROcodone-acetaminophen 5-325 MG tablet  Commonly known as:  NORCO/VICODIN  Take 1-2 tablets by mouth every 4 (four) hours as needed for moderate pain.     MINASTRIN 24 FE 1-20 MG-MCG(24) Chew  Generic drug:  Norethin Ace-Eth Estrad-FE  take 1 tablet daily        Filed Vitals:   07/21/15 0515 07/21/15 1033  BP: 100/50 102/66  Pulse: 90 68  Temp: 98.5 F (36.9 C) 97.9 F (36.6 C)  Resp: 18 18    Skin clean, dry and intact without evidence of skin break down, no evidence of skin tears noted. IV catheter discontinued intact. Site without signs and symptoms of complications. Dressing and pressure applied. Pt denies pain at this time. No complaints noted.  An After Visit Summary was printed and given to the patient. Patient escorted via [place], and D/C home via private auto.  [name] S 07/21/2015 1:41 PM

## 2015-07-21 NOTE — Discharge Instructions (Signed)
[facility] ° °THYROID & PARATHYROID SURGERY:  POST-OP INSTRUCTIONS ° °Always review your discharge instruction sheet from the facility where your surgery was performed. ° °A prescription for pain medication may be given to you upon discharge.  Take your pain medication as prescribed.  If narcotic pain medicine is not needed, then you may take acetaminophen (Tylenol) or ibuprofen (Advil) as needed. ° °Take your usually prescribed medications unless otherwise directed. ° °If you need a refill on your pain medication, please contact your pharmacy. They will contact our office to request authorization.  Prescriptions will not be processed by our office after 5 pm or on weekends. ° °Start with a light diet upon arrival home, such as soup and crackers or toast.  Be sure to drink plenty of fluids daily.  Resume your normal diet the day after surgery. ° °Most patients will experience some swelling and bruising on the chest and neck area.  Ice packs will help.  Swelling and bruising can take several days to resolve.  ° °It is common to experience some constipation after surgery.  Increasing fluid intake and taking a stool softener will usually help or prevent this problem.  A mild laxative (Milk of Magnesia or Miralax) should be taken according to package directions if there has been no bowel movement after 48 hours. ° °You have steri-strips and a gauze dressing over your incision.  You may remove the gauze bandage on the second day after surgery, and you may shower at that time.  Leave your steri-strips (small skin tapes) in place directly over the incision.  These strips should remain on the skin for 5-7 days and then be removed.  You may get them wet in the shower and pat them dry. ° °You may resume regular (light) daily activities beginning the next day - such as daily self-care, walking, climbing stairs - gradually increasing activities as tolerated.  You may have sexual intercourse when it is  comfortable.  Refrain from any heavy lifting or straining until approved by your doctor.  You may drive when you no longer are taking prescription pain medication, you can comfortably wear a seatbelt, and you can safely maneuver your car and apply brakes. ° °You should see your doctor in the office for a follow-up appointment approximately two to three weeks after your surgery.  Make sure that you call for this appointment within a day or two after you arrive home to insure a convenient appointment time. ° °WHEN TO CALL YOUR DOCTOR: °-- Fever greater than 101.5 °-- Inability to urinate °-- Nausea and/or vomiting - persistent °-- Extreme swelling or bruising °-- Continued bleeding from incision °-- Increased pain, redness, or drainage from the incision °-- Difficulty swallowing or breathing °-- Muscle cramping or spasms °-- Numbness or tingling in hands or around lips ° °The clinic staff is available to answer your questions during regular business hours.  Please don’t hesitate to call and ask to speak to one of the nurses if you have concerns. ° °Donna Minogue M. Beverley Sherrard, MD, FACS °General & Endocrine Surgery °[facility] °Office: [phone] ° °Website: [email or web address] ° ° °

## 2015-07-21 NOTE — Discharge Summary (Signed)
  Physician Discharge Summary [facility]  Patient ID: LATORY DELOERA MRN: [id number] DOB/AGE: September 20, 1990 25 y.o.  Admit date: 07/20/2015 Discharge date: 07/21/2015  Admission Diagnoses:  Papillary thyroid carcinoma  Discharge Diagnoses:  Principal Problem:   Papillary thyroid carcinoma [facility])   Discharged Condition: good  Hospital Course: Patient was admitted for observation following thyroid surgery.  Post op course was uncomplicated.  Pain was well controlled.  Tolerated diet.  Post op calcium level on morning following surgery was 8.1 mg/dl.  IV calcium gluconate 2 gm given prior to discharge home.  Patient was prepared for discharge home on POD#1.  Consults: None  Treatments: surgery: total thyroidectomy, limited lymph node dissection, autotransplantation of parathyroid gland  Discharge Exam: Blood pressure 100/50, pulse 90, temperature 98.5 F (36.9 C), temperature source Oral, resp. rate 18, height 5' 2.25" (1.581 m), weight 50.916 kg (112 lb 4 oz), last menstrual period 07/09/2015, SpO2 99 %. HEENT - clear Neck - wound dry and intact with minimal STS; voice normal Chest - clear bilaterally Cor - RRR  Disposition: Home  Discharge Instructions    Ice pack    Complete by:  As directed      Suggamadex Discharge Instructions    Complete by:  As directed   During your recent anesthetic, you were given the medication sugammadex (Bridion). This medication interacts with hormonal forms of birth control (oral contraceptives and injected or implanted birth control) and may make them ineffective. IF YOU USE ANY HORMONAL FORM OF BIRTH CONTROL, YOU MUST USE AN ADDITIONAL BARRIER BIRTH CONTROL METHOD FOR SEVEN DAYS after receiving sugammadex (Bridion) or there is a chance you could become pregnant.            Medication List    TAKE these medications        acetaminophen 500 MG tablet  Commonly known as:  TYLENOL  Take 500 mg by mouth every 6 (six) hours as  needed.     calcium carbonate 1250 (500 Ca) MG tablet  Commonly known as:  OS-CAL - dosed in mg of elemental calcium  Take 2 tablets (1,000 mg of elemental calcium total) by mouth 3 (three) times daily with meals.     HYDROcodone-acetaminophen 5-325 MG tablet  Commonly known as:  NORCO/VICODIN  Take 1-2 tablets by mouth every 4 (four) hours as needed for moderate pain.     MINASTRIN 24 FE 1-20 MG-MCG(24) Chew  Generic drug:  Norethin Ace-Eth Estrad-FE  take 1 tablet daily           Follow-up Information    Follow up with Earnstine Regal, MD. Schedule an appointment as soon as possible for a visit in 3 weeks.   Specialty:  General Surgery   Why:  For wound re-check   Contact information:   [place] [address] [phone]       Earnstine Regal, MD, [facility] Office: [phone]   Signed: Earnstine Regal 07/21/2015, 8:19 AM

## 2015-08-23 ENCOUNTER — Other Ambulatory Visit (HOSPITAL_COMMUNITY): Payer: Self-pay | Admitting: Internal Medicine

## 2015-08-23 DIAGNOSIS — C73 Malignant neoplasm of thyroid gland: Secondary | ICD-10-CM

## 2015-10-13 ENCOUNTER — Encounter (HOSPITAL_COMMUNITY)
Admission: RE | Admit: 2015-10-13 | Discharge: 2015-10-13 | Disposition: A | Discharge status: Home / Self Care | Payer: BLUE CROSS/BLUE SHIELD | Source: Ambulatory Visit | Attending: Internal Medicine | Admitting: Internal Medicine

## 2015-10-13 DIAGNOSIS — Z3200 Encounter for pregnancy test, result unknown: Secondary | ICD-10-CM | POA: Diagnosis not present

## 2015-10-13 DIAGNOSIS — C73 Malignant neoplasm of thyroid gland: Secondary | ICD-10-CM | POA: Diagnosis present

## 2015-10-13 MED ORDER — THYROTROPIN ALFA 1.1 MG IM SOLR
INTRAMUSCULAR | Status: AC
  Filled 2015-10-13: qty 0.9

## 2015-10-13 MED ORDER — THYROTROPIN ALFA 1.1 MG IM SOLR
0.9000 mg | INTRAMUSCULAR | Status: AC
  Administered 2015-10-13: 0.9 mg via INTRAMUSCULAR

## 2015-10-14 ENCOUNTER — Encounter (HOSPITAL_COMMUNITY)
Admission: RE | Admit: 2015-10-14 | Discharge: 2015-10-14 | Disposition: A | Discharge status: Home / Self Care | Payer: BLUE CROSS/BLUE SHIELD | Source: Ambulatory Visit | Attending: Internal Medicine | Admitting: Internal Medicine

## 2015-10-14 DIAGNOSIS — C73 Malignant neoplasm of thyroid gland: Secondary | ICD-10-CM | POA: Diagnosis not present

## 2015-10-14 MED ORDER — THYROTROPIN ALFA 1.1 MG IM SOLR
INTRAMUSCULAR | Status: AC
  Filled 2015-10-14: qty 0.9

## 2015-10-14 MED ORDER — THYROTROPIN ALFA 1.1 MG IM SOLR
0.9000 mg | INTRAMUSCULAR | Status: AC
  Administered 2015-10-14: 0.9 mg via INTRAMUSCULAR

## 2015-10-15 ENCOUNTER — Encounter (HOSPITAL_COMMUNITY)
Admission: RE | Admit: 2015-10-15 | Discharge: 2015-10-15 | Disposition: A | Discharge status: Home / Self Care | Payer: BLUE CROSS/BLUE SHIELD | Source: Ambulatory Visit | Attending: Internal Medicine | Admitting: Internal Medicine

## 2015-10-15 DIAGNOSIS — C73 Malignant neoplasm of thyroid gland: Secondary | ICD-10-CM | POA: Diagnosis not present

## 2015-10-15 LAB — HCG, SERUM, QUALITATIVE: Preg, Serum: NEGATIVE

## 2015-10-15 MED ORDER — SODIUM IODIDE I 131 CAPSULE
97.5000 | Freq: Once | INTRAVENOUS | Status: AC | PRN
  Administered 2015-10-15: 97.5 via ORAL

## 2015-10-25 ENCOUNTER — Encounter (HOSPITAL_COMMUNITY)
Admission: RE | Admit: 2015-10-25 | Discharge: 2015-10-25 | Disposition: A | Discharge status: Home / Self Care | Payer: BLUE CROSS/BLUE SHIELD | Source: Ambulatory Visit | Attending: Internal Medicine | Admitting: Internal Medicine

## 2015-10-25 DIAGNOSIS — C73 Malignant neoplasm of thyroid gland: Secondary | ICD-10-CM | POA: Diagnosis not present

## 2016-01-21 ENCOUNTER — Emergency Department (HOSPITAL_COMMUNITY)
Admission: EM | Admit: 2016-01-21 | Discharge: 2016-01-22 | Disposition: A | Discharge status: Home / Self Care | Payer: BLUE CROSS/BLUE SHIELD | Attending: Emergency Medicine | Admitting: Emergency Medicine

## 2016-01-21 ENCOUNTER — Encounter (HOSPITAL_COMMUNITY): Payer: Self-pay | Admitting: Oncology

## 2016-01-21 ENCOUNTER — Emergency Department (HOSPITAL_COMMUNITY): Payer: BLUE CROSS/BLUE SHIELD

## 2016-01-21 DIAGNOSIS — Z79899 Other long term (current) drug therapy: Secondary | ICD-10-CM | POA: Diagnosis not present

## 2016-01-21 DIAGNOSIS — F909 Attention-deficit hyperactivity disorder, unspecified type: Secondary | ICD-10-CM | POA: Insufficient documentation

## 2016-01-21 DIAGNOSIS — R0602 Shortness of breath: Secondary | ICD-10-CM | POA: Diagnosis not present

## 2016-01-21 DIAGNOSIS — R079 Chest pain, unspecified: Secondary | ICD-10-CM | POA: Diagnosis not present

## 2016-01-21 DIAGNOSIS — Z85850 Personal history of malignant neoplasm of thyroid: Secondary | ICD-10-CM | POA: Insufficient documentation

## 2016-01-21 LAB — D-DIMER, QUANTITATIVE (NOT AT ARMC): D DIMER QUANT: 0.46 ug{FEU}/mL (ref 0.00–0.50)

## 2016-01-21 LAB — BASIC METABOLIC PANEL
ANION GAP: 10 (ref 5–15)
BUN: 10 mg/dL (ref 6–20)
CALCIUM: 8.7 mg/dL — AB (ref 8.9–10.3)
CO2: 22 mmol/L (ref 22–32)
Chloride: 108 mmol/L (ref 101–111)
Creatinine, Ser: 0.58 mg/dL (ref 0.44–1.00)
GFR calc Af Amer: >60 mL/min (ref >60)
GFR calc non Af Amer: >60 mL/min (ref >60)
GLUCOSE: 97 mg/dL (ref 65–99)
POTASSIUM: 3.6 mmol/L (ref 3.5–5.1)
Sodium: 140 mmol/L (ref 135–145)

## 2016-01-21 LAB — I-STAT TROPONIN, ED: TROPONIN I, POC: 0 ng/mL (ref 0.00–0.08)

## 2016-01-21 MED ORDER — KETOROLAC TROMETHAMINE 60 MG/2ML IM SOLN
60.0000 mg | Freq: Once | INTRAMUSCULAR | Status: AC
  Administered 2016-01-21: 60 mg via INTRAMUSCULAR
  Filled 2016-01-21: qty 2

## 2016-01-21 NOTE — ED Provider Notes (Signed)
CSN: [id number]     Arrival date & time 01/21/16  1955 History   By signing my name below, I, Donna Mueller. Donna Mueller, attest that this documentation has been prepared under the direction and in the presence of Malvin Johns, MD.  Electronically Signed: [name]. Donna Mueller, ED Scribe. 01/21/2016. 11:33 PM.   Chief Complaint  Patient presents with  . Chest Pain   The history is provided by the patient. No language interpreter was used.    HPI Comments: Donna Mueller is a 25 y.o. female with a PMHx of GERD who presents to the Emergency Department complaining of waxing and waning L sided chest pain with associated nausea and intermittent shortness of breath x 1 week; worsened in last 2-3 days. Currently pain is rated 8/10 and described as sharp/stabbing. Pain is made worse with movement, ambulation, and deep breathing without any alleviating factors. OTC Ibuprofen attempted at home without any improvement. Denies any fever, chills, vomiting, dizziness. PSHx includes thyroidectomy on 1/110/17. No prior history of blood clots. Denies any recent long distance travel. She is not a smoker. No prior history of heart issues. She is currently on oral birth control; currently on her monthly menses.  PCP: Donna Mueller    Past Medical History  Diagnosis Date  . Headache     HX MIGRAINES  . Knee pain     LEFT  . GERD (gastroesophageal reflux disease)   . Papillary thyroid carcinoma ([place])   . Difficulty sleeping   . ADD (attention deficit disorder)    Past Surgical History  Procedure Laterality Date  . Thyroidectomy N/A 07/20/2015    Procedure: TOTAL THYROIDECTOMY WITH LIMITED CENTRAL COMPARTMENT  LYMPH NODE DISSECTION  AND AUTO-TRANSPLANTATION OF LEFT INFERIOR PARATHYROID ;  Surgeon: Armandina Gemma, MD;  Location: [facility];  Service: General;  Laterality: N/A;  . Lymph node dissection N/A 07/20/2015    Procedure: LYMPH NODE DISSECTION;  Surgeon: Armandina Gemma, MD;  Location: [facility];  Service: General;   Laterality: N/A;   No family history on file. Social History  Substance Use Topics  . Smoking status: Never Smoker   . Smokeless tobacco: None  . Alcohol Use: Yes     Comment: OCCASIONAL   OB History    No data available     Review of Systems  Constitutional: Negative for fever, chills, diaphoresis and fatigue.  HENT: Negative for congestion, rhinorrhea and sneezing.   Eyes: Negative.   Respiratory: Positive for shortness of breath. Negative for cough and chest tightness.   Cardiovascular: Positive for chest pain. Negative for leg swelling.  Gastrointestinal: Positive for nausea. Negative for vomiting, abdominal pain, diarrhea and blood in stool.  Genitourinary: Negative for frequency, hematuria, flank pain and difficulty urinating.  Musculoskeletal: Negative for back pain and arthralgias.  Skin: Negative for rash.  Neurological: Negative for dizziness, speech difficulty, weakness, numbness and headaches.      Allergies  Review of patient's allergies indicates no known allergies.  Home Medications   Prior to Admission medications   Medication Sig Start Date End Date Taking? Authorizing Provider  acetaminophen (TYLENOL) 500 MG tablet Take 500 mg by mouth every 6 (six) hours as needed.    Historical Provider, MD  calcium carbonate (OS-CAL - DOSED IN MG OF ELEMENTAL CALCIUM) 1250 (500 Ca) MG tablet Take 2 tablets (1,000 mg of elemental calcium total) by mouth 3 (three) times daily with meals. 07/21/15   Armandina Gemma, MD  HYDROcodone-acetaminophen (NORCO/VICODIN) 5-325 MG tablet Take 1-2  tablets by mouth every 4 (four) hours as needed for moderate pain. 07/21/15   Armandina Gemma, MD  MINASTRIN 24 FE 1-20 MG-MCG(24) CHEW take 1 tablet daily 06/14/15   Historical Provider, MD  naproxen (NAPROSYN) 375 MG tablet Take 1 tablet (375 mg total) by mouth 2 (two) times daily. 01/22/16   Malvin Johns, MD   Triage Vitals: BP 109/73 mmHg  Pulse 77  Temp(Src) 98.5 F (36.9 C) (Oral)  Resp 16   Ht 5\' 2"  (1.575 m)  Wt 116 lb (52.617 kg)  BMI 21.21 kg/m2  SpO2 100%  LMP 12/22/2015 (Exact Date)   Physical Exam  Constitutional: She is oriented to person, place, and time. She appears well-developed and well-nourished.  HENT:  Head: Normocephalic and atraumatic.  Eyes: EOM are normal. Pupils are equal, round, and reactive to light.  Neck: Normal range of motion. Neck supple.  Cardiovascular: Normal rate, regular rhythm and normal heart sounds.   Pulmonary/Chest: Effort normal and breath sounds normal. No respiratory distress. She has no wheezes. She has no rales. She exhibits tenderness.  Reproducible tenderness to L anterior chest wall.  Abdominal: Soft. Bowel sounds are normal. She exhibits no distension. There is no tenderness. There is no rebound and no guarding.  Musculoskeletal: Normal range of motion. She exhibits no edema.  No edema or calf tenderness.  Lymphadenopathy:    She has no cervical adenopathy.  Neurological: She is alert and oriented to person, place, and time.  Skin: Skin is warm and dry. No rash noted.  Psychiatric: She has a normal mood and affect.  Nursing note and vitals reviewed.   ED Course  Procedures (including critical care time)  DIAGNOSTIC STUDIES: Oxygen Saturation is 100% on RA, Normal by my interpretation.    COORDINATION OF CARE: 11:12 PM-Discussed treatment plan with pt at bedside and pt agreed to plan.     Labs Review Labs Reviewed  BASIC METABOLIC PANEL - Abnormal; Notable for the following:    Calcium 8.7 (*)    All other components within normal limits  CBC WITH DIFFERENTIAL/PLATELET - Abnormal; Notable for the following:    WBC 10.8 (*)    Neutro Abs 9.1 (*)    All other components within normal limits  D-DIMER, QUANTITATIVE (NOT AT [facility])  Randolm Idol, ED    Imaging Review Dg Chest 2 View  01/21/2016  CLINICAL DATA:  Left-sided chest pain radiates to back and left arm for about 1 week. EXAM: CHEST  2 VIEW COMPARISON:   07/15/2015 FINDINGS: The heart size and mediastinal contours are within normal limits. Both lungs are clear. The visualized skeletal structures are unremarkable. IMPRESSION: No active cardiopulmonary disease. Electronically Signed   By: Misty Stanley M.D.   On: 01/21/2016 22:38   I have personally reviewed and evaluated these images and lab results as part of my medical decision-making.   EKG Interpretation   Date/Time:  Friday January 21 2016 22:05:34 EDT Ventricular Rate:  82 PR Interval:    QRS Duration: 97 QT Interval:  571 QTC Calculation: 668 R Axis:   80 Text Interpretation:  Sinus rhythm Nonspecific T abnrm, anterolateral  leads Prolonged QT interval No old tracing to compare Confirmed by Averlee Swartz   MD, Rayan Dyal ([id number]) on 01/21/2016 10:58:30 PM      MDM   Final diagnoses:  Chest pain, unspecified chest pain type    Patient presents with left-sided chest pain. It is reproducible on palpation. She has no hypoxia. Her d-dimer is negative without other  suggestions of pulmonary embolus. Her chest x-ray is clear without pneumonia or pneumothorax. Her EKG doesn't show any ischemic changes. There is a prolonged QT interval which I did advise the patient of. I advised her she needs to follow-up with her PCP. I feel her symptoms are likely musculoskeletal/pleuritic in nature. She was discharged home in good condition. She was started on Naprosyn. She was given dose of Toradol with improvement in the ED. She was advised to follow-up with her PCP or return here as needed for any worsening symptoms.  I personally performed the services described in this documentation, which was scribed in my presence.  The recorded information has been reviewed and considered.   Malvin Johns, MD 01/22/16 [phone]

## 2016-01-21 NOTE — ED Notes (Signed)
Pt reports left sided chest pain w/ radiation to her back and left arm x 1 week.  Pt does use birth control.  Pain is worse w/ breathing.  Pt rates pain 8/10, sharp and stabbing in nature.  Denies any injury to area.  Intermittent Shob.

## 2016-01-21 NOTE — ED Notes (Signed)
Pt c/o L upper chest pain radiating to upper back and L axiliary area x 1 week, worse last 2 days.  At rest 7/10, moving or deep breathing 10+. Pt denies injury, no deformity of crepitus noted, Lungs clear. Tender to palpation.

## 2016-01-22 LAB — CBC WITH DIFFERENTIAL/PLATELET
BAND NEUTROPHILS: 0 %
BASOS ABS: 0 10*3/uL (ref 0.0–0.1)
BASOS PCT: 0 %
Blasts: 0 %
EOS ABS: 0 10*3/uL (ref 0.0–0.7)
Eosinophils Relative: 0 %
HCT: 38.2 % (ref 36.0–46.0)
Hemoglobin: 12.9 g/dL (ref 12.0–15.0)
LYMPHS ABS: 0.8 10*3/uL (ref 0.7–4.0)
LYMPHS PCT: 7 %
MCH: 31.5 pg (ref 26.0–34.0)
MCHC: 33.8 g/dL (ref 30.0–36.0)
MCV: 93.4 fL (ref 78.0–100.0)
METAMYELOCYTES PCT: 0 %
MONO ABS: 0.9 10*3/uL (ref 0.1–1.0)
MONOS PCT: 8 %
Myelocytes: 0 %
NEUTROS ABS: 9.1 10*3/uL — AB (ref 1.7–7.7)
Neutrophils Relative %: 85 %
PLATELETS: 195 10*3/uL (ref 150–400)
Promyelocytes Absolute: 0 %
RBC: 4.09 MIL/uL (ref 3.87–5.11)
RDW: 12.1 % (ref 11.5–15.5)
WBC: 10.8 10*3/uL — ABNORMAL HIGH (ref 4.0–10.5)
nRBC: 0 /100 WBC

## 2016-01-22 MED ORDER — NAPROXEN 375 MG PO TABS: 375.0000 mg | ORAL_TABLET | Freq: Two times a day (BID) | ORAL | Status: DC

## 2016-01-22 NOTE — Discharge Instructions (Signed)
Nonspecific Chest Pain  °Chest pain can be caused by many different conditions. There is always a chance that your pain could be related to something serious, such as a heart attack or a blood clot in your lungs. Chest pain can also be caused by conditions that are not life-threatening. If you have chest pain, it is very important to follow up with your health care provider. °CAUSES  °Chest pain can be caused by: °· Heartburn. °· Pneumonia or bronchitis. °· Anxiety or stress. °· Inflammation around your heart (pericarditis) or lung (pleuritis or pleurisy). °· A blood clot in your lung. °· A collapsed lung (pneumothorax). It can develop suddenly on its own (spontaneous pneumothorax) or from trauma to the chest. °· Shingles infection (varicella-zoster virus). °· Heart attack. °· Damage to the bones, muscles, and cartilage that make up your chest wall. This can include: °¨ Bruised bones due to injury. °¨ Strained muscles or cartilage due to frequent or repeated coughing or overwork. °¨ Fracture to one or more ribs. °¨ Sore cartilage due to inflammation (costochondritis). °RISK FACTORS  °Risk factors for chest pain may include: °· Activities that increase your risk for trauma or injury to your chest. °· Respiratory infections or conditions that cause frequent coughing. °· Medical conditions or overeating that can cause heartburn. °· Heart disease or family history of heart disease. °· Conditions or health behaviors that increase your risk of developing a blood clot. °· Having had chicken pox (varicella zoster). °SIGNS AND SYMPTOMS °Chest pain can feel like: °· Burning or tingling on the surface of your chest or deep in your chest. °· Crushing, pressure, aching, or squeezing pain. °· Dull or sharp pain that is worse when you move, cough, or take a deep breath. °· Pain that is also felt in your back, neck, shoulder, or arm, or pain that spreads to any of these areas. °Your chest pain may come and go, or it may stay  constant. °DIAGNOSIS °Lab tests or other studies may be needed to find the cause of your pain. Your health care provider may have you take a test called an ambulatory ECG (electrocardiogram). An ECG records your heartbeat patterns at the time the test is performed. You may also have other tests, such as: °· Transthoracic echocardiogram (TTE). During echocardiography, sound waves are used to create a picture of all of the heart structures and to look at how blood flows through your heart. °· Transesophageal echocardiogram (TEE). This is a more advanced imaging test that obtains images from inside your body. It allows your health care provider to see your heart in finer detail. °· Cardiac monitoring. This allows your health care provider to monitor your heart rate and rhythm in real time. °· Holter monitor. This is a portable device that records your heartbeat and can help to diagnose abnormal heartbeats. It allows your health care provider to track your heart activity for several days, if needed. °· Stress tests. These can be done through exercise or by taking medicine that makes your heart beat more quickly. °· Blood tests. °· Imaging tests. °TREATMENT  °Your treatment depends on what is causing your chest pain. Treatment may include: °· Medicines. These may include: °¨ Acid blockers for heartburn. °¨ Anti-inflammatory medicine. °¨ Pain medicine for inflammatory conditions. °¨ Antibiotic medicine, if an infection is present. °¨ Medicines to dissolve blood clots. °¨ Medicines to treat coronary artery disease. °· Supportive care for conditions that do not require medicines. This may include: °¨ Resting. °¨ Applying heat   or cold packs to injured areas. °¨ Limiting activities until pain decreases. °HOME CARE INSTRUCTIONS °· If you were prescribed an antibiotic medicine, finish it all even if you start to feel better. °· Avoid any activities that bring on chest pain. °· Do not use any tobacco products, including  cigarettes, chewing tobacco, or electronic cigarettes. If you need help quitting, ask your health care provider. °· Do not drink alcohol. °· Take medicines only as directed by your health care provider. °· Keep all follow-up visits as directed by your health care provider. This is important. This includes any further testing if your chest pain does not go away. °· If heartburn is the cause for your chest pain, you may be told to keep your head raised (elevated) while sleeping. This reduces the chance that acid will go from your stomach into your esophagus. °· Make lifestyle changes as directed by your health care provider. These may include: °¨ Getting regular exercise. Ask your health care provider to suggest some activities that are safe for you. °¨ Eating a heart-healthy diet. A registered dietitian can help you to learn healthy eating options. °¨ Maintaining a healthy weight. °¨ Managing diabetes, if necessary. °¨ Reducing stress. °SEEK MEDICAL CARE IF: °· Your chest pain does not go away after treatment. °· You have a rash with blisters on your chest. °· You have a fever. °SEEK IMMEDIATE MEDICAL CARE IF:  °· Your chest pain is worse. °· You have an increasing cough, or you cough up blood. °· You have severe abdominal pain. °· You have severe weakness. °· You faint. °· You have chills. °· You have sudden, unexplained chest discomfort. °· You have sudden, unexplained discomfort in your arms, back, neck, or jaw. °· You have shortness of breath at any time. °· You suddenly start to sweat, or your skin gets clammy. °· You feel nauseous or you vomit. °· You suddenly feel light-headed or dizzy. °· Your heart begins to beat quickly, or it feels like it is skipping beats. °These symptoms may represent a serious problem that is an emergency. Do not wait to see if the symptoms will go away. Get medical help right away. Call your local emergency services (911 in the [place] Do not drive yourself to the hospital. °  °This  information is not intended to replace advice given to you by your health care provider. Make sure you discuss any questions you have with your health care provider. °  °Document Released: 04/05/2005 Document Revised: 07/17/2014 Document Reviewed: 01/30/2014 °Elsevier Interactive Patient Education ©2016 [employer] ° °

## 2016-03-30 ENCOUNTER — Encounter: Payer: Self-pay | Admitting: Internal Medicine

## 2016-08-09 ENCOUNTER — Other Ambulatory Visit (HOSPITAL_COMMUNITY): Payer: Self-pay | Admitting: Endocrinology

## 2016-08-09 DIAGNOSIS — C73 Malignant neoplasm of thyroid gland: Secondary | ICD-10-CM

## 2016-09-11 DIAGNOSIS — F438 Other reactions to severe stress: Secondary | ICD-10-CM | POA: Diagnosis not present

## 2016-09-11 DIAGNOSIS — G4700 Insomnia, unspecified: Secondary | ICD-10-CM | POA: Diagnosis not present

## 2016-09-11 DIAGNOSIS — F419 Anxiety disorder, unspecified: Secondary | ICD-10-CM | POA: Diagnosis not present

## 2016-09-11 DIAGNOSIS — N943 Premenstrual tension syndrome: Secondary | ICD-10-CM | POA: Diagnosis not present

## 2016-09-25 DIAGNOSIS — E890 Postprocedural hypothyroidism: Secondary | ICD-10-CM | POA: Diagnosis not present

## 2016-10-09 ENCOUNTER — Encounter (HOSPITAL_COMMUNITY)
Admission: RE | Admit: 2016-10-09 | Discharge: 2016-10-09 | Disposition: A | Discharge status: Home / Self Care | Payer: BLUE CROSS/BLUE SHIELD | Source: Ambulatory Visit | Attending: Endocrinology | Admitting: Endocrinology

## 2016-10-09 DIAGNOSIS — C73 Malignant neoplasm of thyroid gland: Secondary | ICD-10-CM | POA: Diagnosis not present

## 2016-10-09 MED ORDER — THYROTROPIN ALFA 1.1 MG IM SOLR
0.9000 mg | INTRAMUSCULAR | Status: AC
  Administered 2016-10-09: 0.9 mg via INTRAMUSCULAR

## 2016-10-09 MED ORDER — STERILE WATER FOR INJECTION IJ SOLN
INTRAMUSCULAR | Status: AC
  Filled 2016-10-09: qty 10

## 2016-10-10 ENCOUNTER — Encounter (HOSPITAL_COMMUNITY)
Admission: RE | Admit: 2016-10-10 | Discharge: 2016-10-10 | Disposition: A | Discharge status: Home / Self Care | Payer: BLUE CROSS/BLUE SHIELD | Source: Ambulatory Visit | Attending: Endocrinology | Admitting: Endocrinology

## 2016-10-10 DIAGNOSIS — C73 Malignant neoplasm of thyroid gland: Secondary | ICD-10-CM | POA: Diagnosis not present

## 2016-10-10 MED ORDER — THYROTROPIN ALFA 1.1 MG IM SOLR
0.9000 mg | INTRAMUSCULAR | Status: AC
  Administered 2016-10-10: 0.9 mg via INTRAMUSCULAR

## 2016-10-10 MED ORDER — STERILE WATER FOR INJECTION IJ SOLN
INTRAMUSCULAR | Status: AC
Start: 2016-10-10 — End: 2016-10-10
  Filled 2016-10-10: qty 10

## 2016-10-11 ENCOUNTER — Encounter (HOSPITAL_COMMUNITY)
Admission: RE | Admit: 2016-10-11 | Discharge: 2016-10-11 | Disposition: A | Discharge status: Home / Self Care | Payer: BLUE CROSS/BLUE SHIELD | Source: Ambulatory Visit | Attending: Endocrinology | Admitting: Endocrinology

## 2016-10-11 DIAGNOSIS — C73 Malignant neoplasm of thyroid gland: Secondary | ICD-10-CM | POA: Diagnosis not present

## 2016-10-11 LAB — HCG, SERUM, QUALITATIVE: PREG SERUM: NEGATIVE

## 2016-10-11 MED ORDER — SODIUM IODIDE I 131 CAPSULE
4.0000 | Freq: Once | INTRAVENOUS | Status: AC | PRN
  Administered 2016-10-11: 4 via ORAL

## 2016-10-13 ENCOUNTER — Encounter (HOSPITAL_COMMUNITY)
Admission: RE | Admit: 2016-10-13 | Discharge: 2016-10-13 | Disposition: A | Discharge status: Home / Self Care | Payer: BLUE CROSS/BLUE SHIELD | Source: Ambulatory Visit | Attending: Endocrinology | Admitting: Endocrinology

## 2016-10-13 DIAGNOSIS — E209 Hypoparathyroidism, unspecified: Secondary | ICD-10-CM | POA: Diagnosis not present

## 2016-10-13 DIAGNOSIS — C73 Malignant neoplasm of thyroid gland: Secondary | ICD-10-CM | POA: Diagnosis not present

## 2016-10-16 ENCOUNTER — Other Ambulatory Visit (HOSPITAL_COMMUNITY): Payer: BLUE CROSS/BLUE SHIELD

## 2016-10-17 ENCOUNTER — Other Ambulatory Visit (HOSPITAL_COMMUNITY): Payer: BLUE CROSS/BLUE SHIELD

## 2016-10-18 ENCOUNTER — Other Ambulatory Visit (HOSPITAL_COMMUNITY): Payer: BLUE CROSS/BLUE SHIELD

## 2016-10-20 ENCOUNTER — Other Ambulatory Visit (HOSPITAL_COMMUNITY): Payer: BLUE CROSS/BLUE SHIELD

## 2017-02-03 IMAGING — CR DG CHEST 2V
2 series · 2 of 2 positions shown · non-contrast
Comparison: None in PACs

CLINICAL DATA: Preoperative exam prior to thyroidectomy, no chest
complaints, nonsmoker.

EXAM:
CHEST  2 VIEW

[w chest pa]
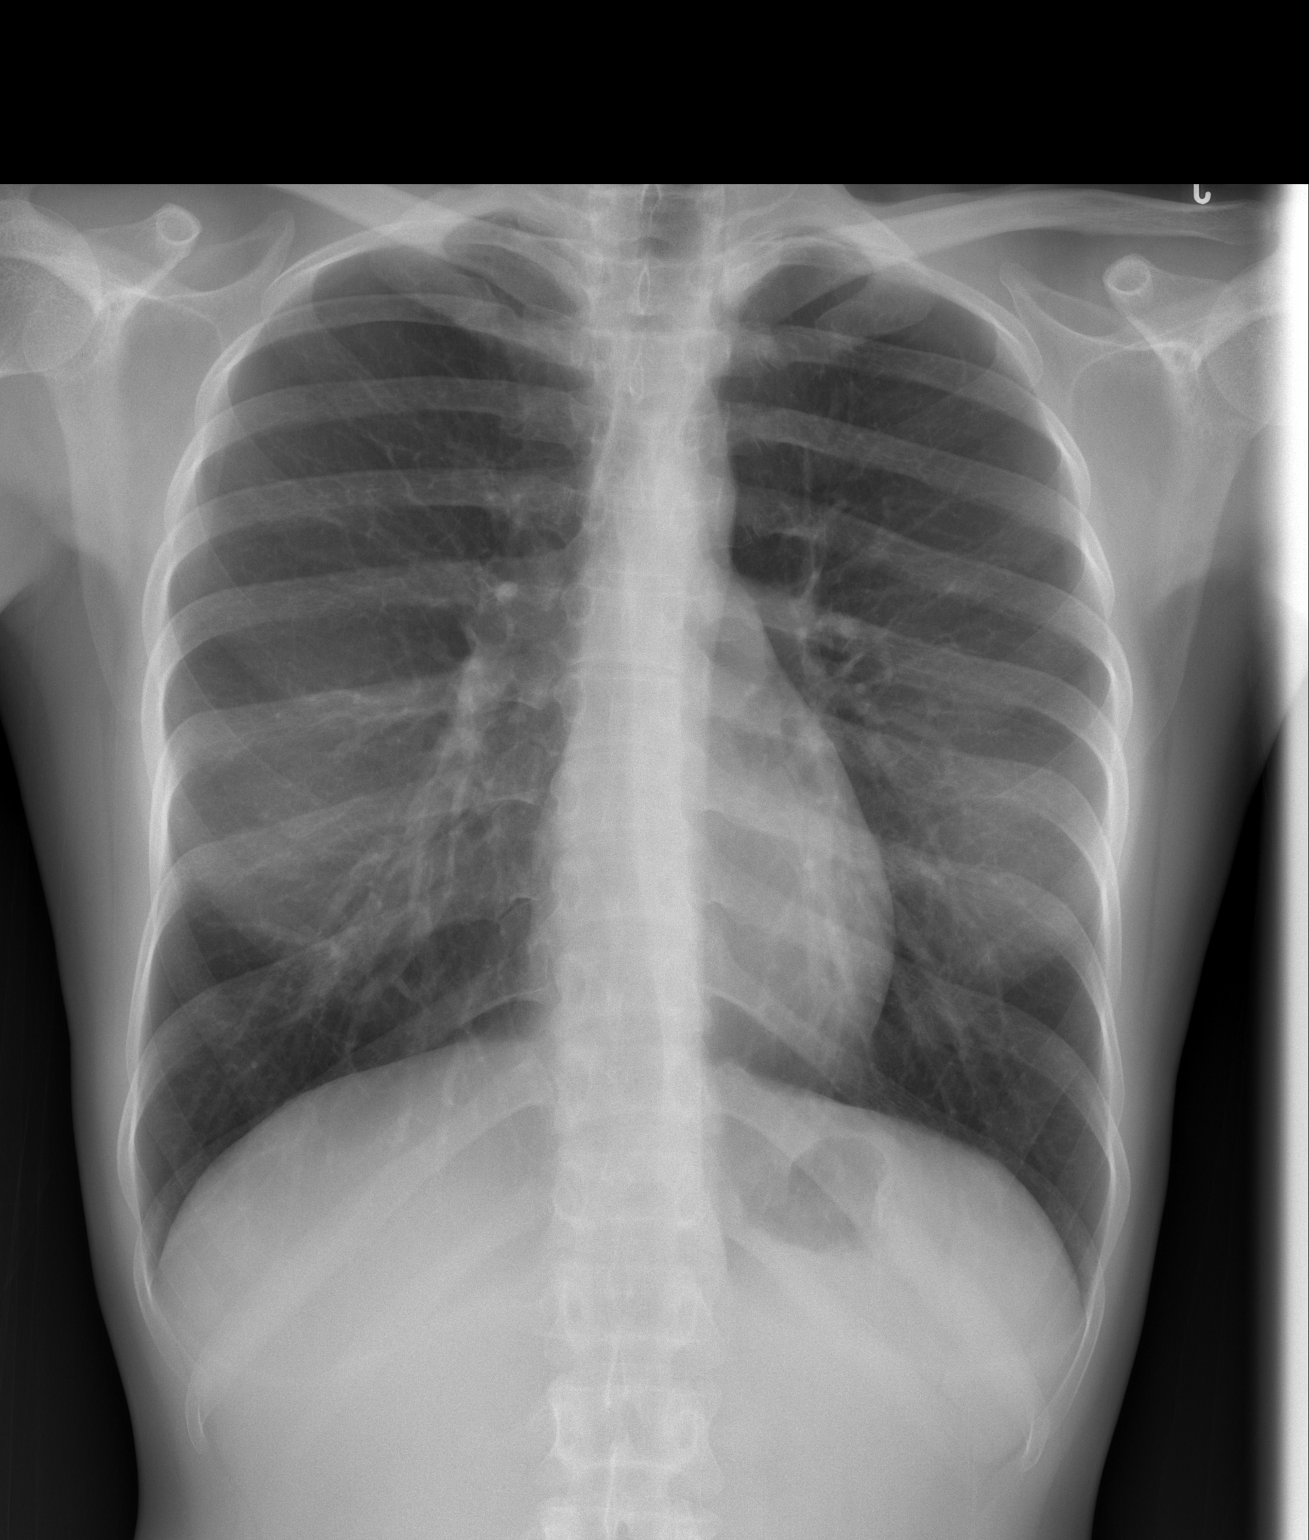

[w chest lat]
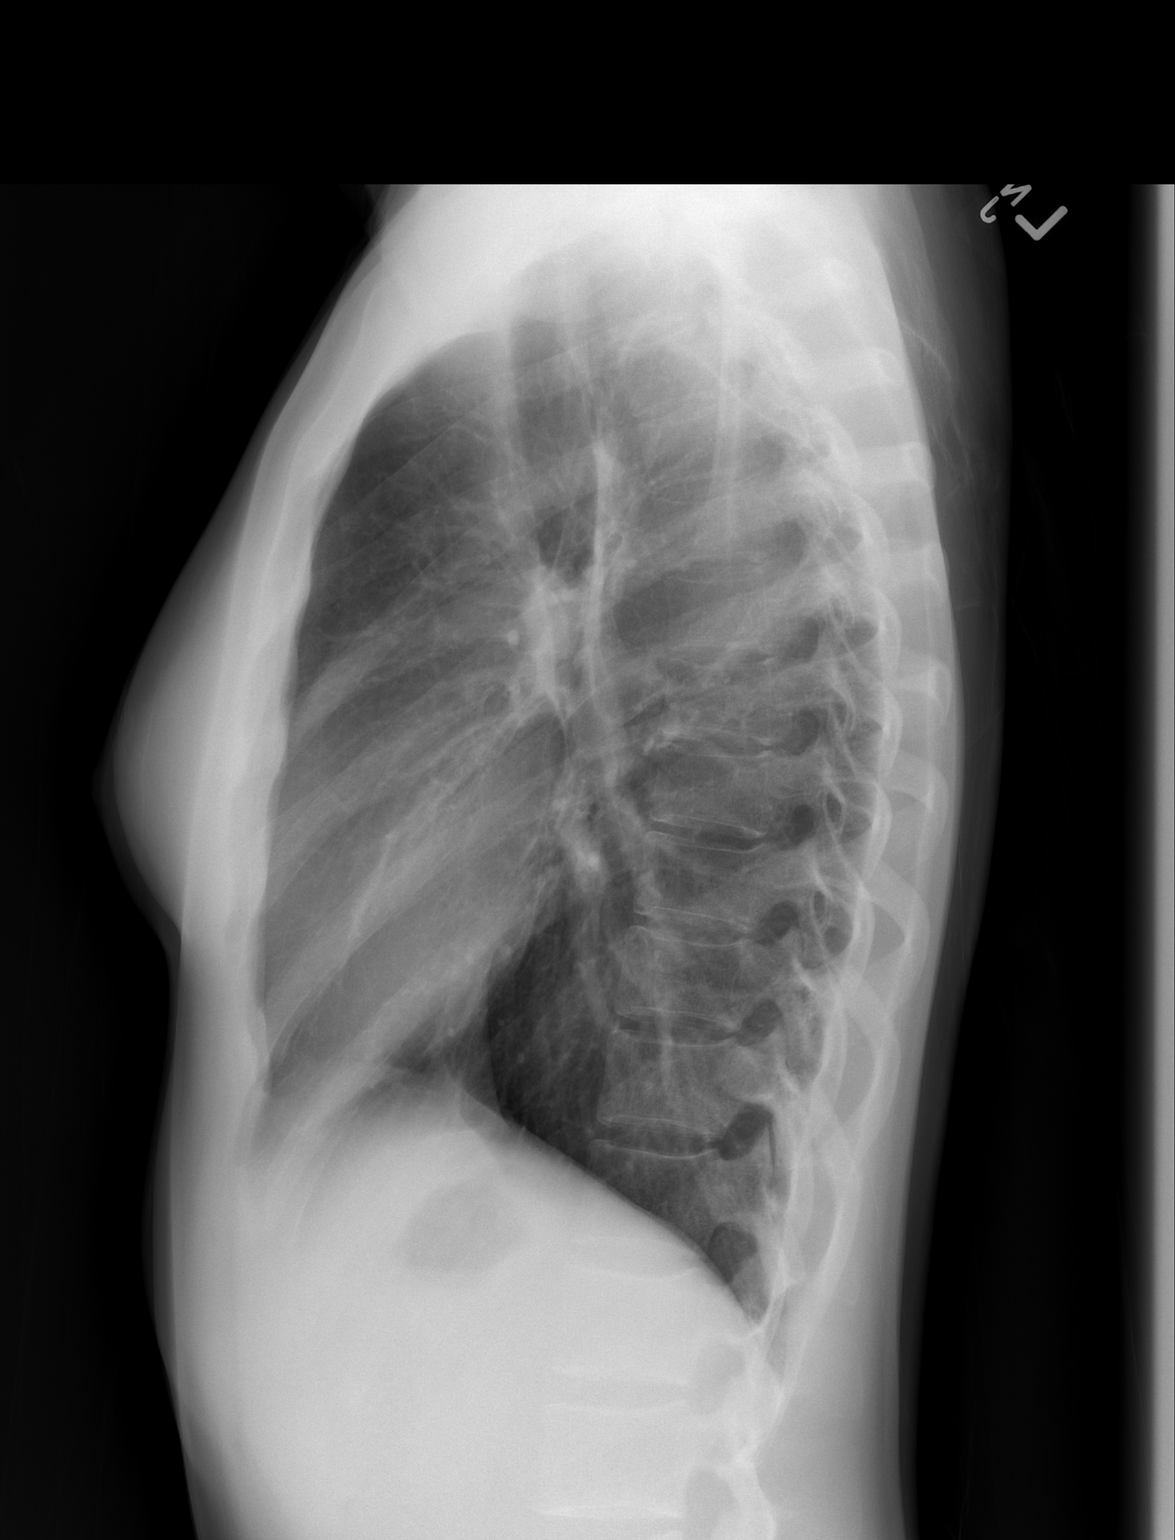

[2 of 2 positions shown; findings below may reference images not displayed]

FINDINGS: The lungs are well-expanded and clear. The heart and pulmonary
vascularity are normal. The mediastinum is normal in width. There is
no pleural effusion. The bony thorax is unremarkable.
IMPRESSION: There is no active cardiopulmonary disease.

## 2017-07-12 ENCOUNTER — Encounter: Payer: Self-pay | Admitting: Family Medicine

## 2017-07-12 ENCOUNTER — Ambulatory Visit: Payer: BLUE CROSS/BLUE SHIELD | Admitting: Family Medicine

## 2017-07-12 VITALS — BP 100/70 | HR 97 | Temp 98.2°F | Ht 63.0 in | Wt 126.2 lb

## 2017-07-12 DIAGNOSIS — E890 Postprocedural hypothyroidism: Secondary | ICD-10-CM | POA: Insufficient documentation

## 2017-07-12 DIAGNOSIS — J0100 Acute maxillary sinusitis, unspecified: Secondary | ICD-10-CM

## 2017-07-12 DIAGNOSIS — Z3041 Encounter for surveillance of contraceptive pills: Secondary | ICD-10-CM | POA: Insufficient documentation

## 2017-07-12 MED ORDER — BENZONATATE 100 MG PO CAPS: 100.0000 mg | ORAL_CAPSULE | Freq: Two times a day (BID) | ORAL | 0 refills | Status: DC | PRN

## 2017-07-12 MED ORDER — AMOXICILLIN-POT CLAVULANATE 875-125 MG PO TABS: 1.0000 | ORAL_TABLET | Freq: Two times a day (BID) | ORAL | 0 refills | Status: AC

## 2017-07-12 NOTE — Progress Notes (Signed)
Subjective   CC:  Chief Complaint  Patient presents with  . Establish Care  . Cough    x 4 days  . Chest congestion  . Nasal Congestion    yellow / green drainage  . Headache    HPI: Donna Mueller is a 27 y.o. female who presents to the office today to address the problems listed above in the chief complaint. New patient. Has gyn appt upcoming for female wellness with GYN.   Patient reports dental pain, sinus congestion and pressure with thick drainage, mild nonproductive cough, ear pressure without pain, and mild malaise.  Symptoms have been present for several (5) days.Shedenies high fevers, GI symptoms, shortness of breath. Shehas had sinus infections in the past and this feels similar.  Patient is a non-smoker.  No history of asthma or COPD. Pt declines flu shot today due to illness. I reviewed the patients updated PMH, FH, and SocHx.    Patient Active Problem List   Diagnosis Date Noted  . Oral contraceptive use 07/12/2017  . Post-surgical hypothyroidism 07/12/2017  . Papillary thyroid carcinoma ([place]) 07/19/2015   Current Meds  Medication Sig  . acetaminophen (TYLENOL) 500 MG tablet Take 500 mg by mouth every 6 (six) hours as needed.  . JUNEL FE 24 1-20 MG-MCG(24) tablet TK 1 T PO  EVERY DAY  . Multiple Vitamin (MULTIVITAMIN) tablet Take 1 tablet by mouth daily.  . naproxen (NAPROSYN) 375 MG tablet Take 1 tablet (375 mg total) by mouth 2 (two) times daily.  Marland Kitchen SYNTHROID 137 MCG tablet Take 137 mcg by mouth daily.    Review of Systems: Cardiovascular: negative for chest pain Respiratory: negative for SOB or persistent cough Gastrointestinal: negative for abdominal pain Genitourinary: negative for dysuria or gross hematuria Patient Care Team    Relationship Specialty Notifications Start End  Leamon Arnt, MD PCP - General Family Medicine  07/12/17   Jacelyn Pi, MD Consulting Physician Endocrinology  07/12/17   Avon Gully, NP Nurse Practitioner Obstetrics  and Gynecology  07/12/17     Objective  Vitals: BP 100/70 (BP Location: Left Arm, Patient Position: Sitting, Cuff Size: Normal)   Pulse 97   Temp 98.2 F (36.8 C) (Oral)   Ht 5\' 3"  (1.6 m)   Wt 126 lb 4 oz (57.3 kg)   LMP 07/06/2017   SpO2 97%   BMI 22.36 kg/m  General: no acute distress , congested cough Psych:  Alert and oriented, normal mood and affect HEENT:  Normocephalic, atraumatic, TMs with serous effusions or retraction w/o erythema, nasal mucosa is red with purulent drainage, tender maxillary sinus present, OP mild erythematous w/o eudate, supple neck with LAD Cardiovascular:  RRR without murmur or gallop. no peripheral edema Respiratory:  Good breath sounds bilaterally, CTAB with normal respiratory effort Skin:  Warm, no rashes Neurologic:   Mental status is normal. normal gait  Assessment  1. Acute abscess of maxillary sinus   2. Post-surgical hypothyroidism      Plan    Sinusitis: History and exam is most consistent with bacterial sinus infection.  Etiology and prognosis discussed with patient.  Recommend antibiotics as ordered below.  Patient to complete course of antibiotics, use supportive medications like mucolytics and decongestants as needed.  May use Tylenol or Advil if needed.  Symptoms should improve over the next 2 weeks.  Patient will return or call if symptoms persist or worsen.  Follow up: 1-6 months for CPE.     Commons side effects, risks,  benefits, and alternatives for medications and treatment plan prescribed today were discussed, and the patient expressed understanding of the given instructions. Patient is instructed to call or message via MyChart if he/she has any questions or concerns regarding our treatment plan. No barriers to understanding were identified. We discussed Red Flag symptoms and signs in detail. Patient expressed understanding regarding what to do in case of urgent or emergency type symptoms.   Medication list was reconciled, printed  and provided to the patient in AVS. Patient instructions and summary information was reviewed with the patient as documented in the AVS. This note was prepared with assistance of Dragon voice recognition software. Occasional wrong-word or sound-a-like substitutions may have occurred due to the inherent limitations of voice recognition software  No orders of the defined types were placed in this encounter.  Meds ordered this encounter  Medications  . amoxicillin-clavulanate (AUGMENTIN) 875-125 MG tablet    Sig: Take 1 tablet by mouth 2 (two) times daily for 10 days.    Dispense:  20 tablet    Refill:  0  . benzonatate (TESSALON) 100 MG capsule    Sig: Take 1 capsule (100 mg total) by mouth 2 (two) times daily as needed for cough.    Dispense:  20 capsule    Refill:  0

## 2017-07-12 NOTE — Patient Instructions (Signed)
Please return in 1-6 months for your complete physical.  It was a pleasure meeting you today! Thank you for choosing [place] to meet your healthcare needs! I truly look forward to working with you. If you have any questions or concerns, please send me a message via Mychart or call the office at [phone].  You may use Mucinex DM twice a day as needed in addition to your antibiotic to treat your symptoms.    Sinusitis, Adult Sinusitis is soreness and inflammation of your sinuses. Sinuses are hollow spaces in the bones around your face. They are located:  Around your eyes.  In the middle of your forehead.  Behind your nose.  In your cheekbones.  Your sinuses and nasal passages are lined with a stringy fluid (mucus). Mucus normally drains out of your sinuses. When your nasal tissues get inflamed or swollen, the mucus can get trapped or blocked so air cannot flow through your sinuses. This lets bacteria, viruses, and funguses grow, and that leads to infection. Follow these instructions at home: Medicines  Take, use, or apply over-the-counter and prescription medicines only as told by your doctor. These may include nasal sprays.  If you were prescribed an antibiotic medicine, take it as told by your doctor. Do not stop taking the antibiotic even if you start to feel better. Hydrate and Humidify  Drink enough water to keep your pee (urine) clear or pale yellow.  Use a cool mist humidifier to keep the humidity level in your home above 50%.  Breathe in steam for 10-15 minutes, 3-4 times a day or as told by your doctor. You can do this in the bathroom while a hot shower is running.  Try not to spend time in cool or dry air. Rest  Rest as much as possible.  Sleep with your head raised (elevated).  Make sure to get enough sleep each night. General instructions  Put a warm, moist washcloth on your face 3-4 times a day or as told by your doctor. This will help with discomfort.  Wash  your hands often with soap and water. If there is no soap and water, use hand sanitizer.  Do not smoke. Avoid being around people who are smoking (secondhand smoke).  Keep all follow-up visits as told by your doctor. This is important. Contact a doctor if:  You have a fever.  Your symptoms get worse.  Your symptoms do not get better within 10 days. Get help right away if:  You have a very bad headache.  You cannot stop throwing up (vomiting).  You have pain or swelling around your face or eyes.  You have trouble seeing.  You feel confused.  Your neck is stiff.  You have trouble breathing. This information is not intended to replace advice given to you by your health care provider. Make sure you discuss any questions you have with your health care provider. Document Released: 12/13/2007 Document Revised: 02/20/2016 Document Reviewed: 04/21/2015 Elsevier Interactive Patient Education  [employer].

## 2017-07-16 ENCOUNTER — Ambulatory Visit: Payer: BLUE CROSS/BLUE SHIELD | Admitting: Family Medicine

## 2017-07-16 DIAGNOSIS — C73 Malignant neoplasm of thyroid gland: Secondary | ICD-10-CM | POA: Diagnosis not present

## 2017-07-16 DIAGNOSIS — E538 Deficiency of other specified B group vitamins: Secondary | ICD-10-CM | POA: Diagnosis not present

## 2017-07-16 DIAGNOSIS — E209 Hypoparathyroidism, unspecified: Secondary | ICD-10-CM | POA: Diagnosis not present

## 2017-07-16 DIAGNOSIS — E890 Postprocedural hypothyroidism: Secondary | ICD-10-CM | POA: Diagnosis not present

## 2017-07-17 ENCOUNTER — Other Ambulatory Visit: Payer: Self-pay | Admitting: Endocrinology

## 2017-07-17 DIAGNOSIS — C73 Malignant neoplasm of thyroid gland: Secondary | ICD-10-CM

## 2017-07-30 ENCOUNTER — Ambulatory Visit
Admission: RE | Admit: 2017-07-30 | Discharge: 2017-07-30 | Disposition: A | Discharge status: Home / Self Care | Payer: BLUE CROSS/BLUE SHIELD | Source: Ambulatory Visit | Attending: Endocrinology | Admitting: Endocrinology

## 2017-07-30 DIAGNOSIS — C73 Malignant neoplasm of thyroid gland: Secondary | ICD-10-CM

## 2018-07-08 DIAGNOSIS — C73 Malignant neoplasm of thyroid gland: Secondary | ICD-10-CM | POA: Diagnosis not present

## 2018-07-08 DIAGNOSIS — E890 Postprocedural hypothyroidism: Secondary | ICD-10-CM | POA: Diagnosis not present

## 2018-07-08 DIAGNOSIS — E209 Hypoparathyroidism, unspecified: Secondary | ICD-10-CM | POA: Diagnosis not present

## 2018-07-15 DIAGNOSIS — Z6822 Body mass index (BMI) 22.0-22.9, adult: Secondary | ICD-10-CM | POA: Diagnosis not present

## 2018-07-15 DIAGNOSIS — C73 Malignant neoplasm of thyroid gland: Secondary | ICD-10-CM | POA: Diagnosis not present

## 2018-07-15 DIAGNOSIS — F330 Major depressive disorder, recurrent, mild: Secondary | ICD-10-CM | POA: Diagnosis not present

## 2018-07-15 DIAGNOSIS — E538 Deficiency of other specified B group vitamins: Secondary | ICD-10-CM | POA: Diagnosis not present

## 2018-07-15 DIAGNOSIS — E890 Postprocedural hypothyroidism: Secondary | ICD-10-CM | POA: Diagnosis not present

## 2018-07-15 DIAGNOSIS — E209 Hypoparathyroidism, unspecified: Secondary | ICD-10-CM | POA: Diagnosis not present

## 2018-07-15 DIAGNOSIS — R51 Headache: Secondary | ICD-10-CM | POA: Diagnosis not present

## 2018-07-15 DIAGNOSIS — F411 Generalized anxiety disorder: Secondary | ICD-10-CM | POA: Diagnosis not present

## 2018-07-25 DIAGNOSIS — Z6821 Body mass index (BMI) 21.0-21.9, adult: Secondary | ICD-10-CM | POA: Diagnosis not present

## 2018-07-25 DIAGNOSIS — Z01419 Encounter for gynecological examination (general) (routine) without abnormal findings: Secondary | ICD-10-CM | POA: Diagnosis not present

## 2018-07-25 DIAGNOSIS — N760 Acute vaginitis: Secondary | ICD-10-CM | POA: Diagnosis not present

## 2018-08-12 DIAGNOSIS — E890 Postprocedural hypothyroidism: Secondary | ICD-10-CM | POA: Diagnosis not present

## 2018-08-12 DIAGNOSIS — C73 Malignant neoplasm of thyroid gland: Secondary | ICD-10-CM | POA: Diagnosis not present

## 2018-08-20 ENCOUNTER — Other Ambulatory Visit (HOSPITAL_COMMUNITY): Payer: Self-pay | Admitting: Endocrinology

## 2018-08-20 DIAGNOSIS — C73 Malignant neoplasm of thyroid gland: Secondary | ICD-10-CM

## 2018-08-26 ENCOUNTER — Encounter (HOSPITAL_COMMUNITY)
Admission: RE | Admit: 2018-08-26 | Discharge: 2018-08-26 | Disposition: A | Discharge status: Home / Self Care | Payer: BLUE CROSS/BLUE SHIELD | Source: Ambulatory Visit | Attending: Endocrinology | Admitting: Endocrinology

## 2018-08-26 DIAGNOSIS — C73 Malignant neoplasm of thyroid gland: Secondary | ICD-10-CM | POA: Insufficient documentation

## 2018-08-26 MED ORDER — STERILE WATER FOR INJECTION IJ SOLN
INTRAMUSCULAR | Status: AC
  Filled 2018-08-26: qty 10

## 2018-08-26 MED ORDER — THYROTROPIN ALFA 1.1 MG IM SOLR
0.9000 mg | INTRAMUSCULAR | Status: AC
  Administered 2018-08-26: 0.9 mg via INTRAMUSCULAR

## 2018-08-27 ENCOUNTER — Encounter (HOSPITAL_COMMUNITY)
Admission: RE | Admit: 2018-08-27 | Discharge: 2018-08-27 | Disposition: A | Discharge status: Home / Self Care | Payer: BLUE CROSS/BLUE SHIELD | Source: Ambulatory Visit | Attending: Endocrinology | Admitting: Endocrinology

## 2018-08-27 DIAGNOSIS — C73 Malignant neoplasm of thyroid gland: Secondary | ICD-10-CM | POA: Diagnosis not present

## 2018-08-27 MED ORDER — STERILE WATER FOR INJECTION IJ SOLN
INTRAMUSCULAR | Status: AC
  Filled 2018-08-27: qty 10

## 2018-08-27 MED ORDER — THYROTROPIN ALFA 1.1 MG IM SOLR
0.9000 mg | INTRAMUSCULAR | Status: AC
  Administered 2018-08-27: 0.9 mg via INTRAMUSCULAR

## 2018-08-28 ENCOUNTER — Encounter (HOSPITAL_COMMUNITY)
Admission: RE | Admit: 2018-08-28 | Discharge: 2018-08-28 | Disposition: A | Discharge status: Home / Self Care | Payer: BLUE CROSS/BLUE SHIELD | Source: Ambulatory Visit | Attending: Endocrinology | Admitting: Endocrinology

## 2018-08-28 DIAGNOSIS — C73 Malignant neoplasm of thyroid gland: Secondary | ICD-10-CM | POA: Diagnosis not present

## 2018-08-28 LAB — HCG, SERUM, QUALITATIVE: PREG SERUM: NEGATIVE

## 2018-08-30 ENCOUNTER — Encounter (HOSPITAL_COMMUNITY)
Admission: RE | Admit: 2018-08-30 | Discharge: 2018-08-30 | Disposition: A | Discharge status: Home / Self Care | Payer: BLUE CROSS/BLUE SHIELD | Source: Ambulatory Visit | Attending: Endocrinology | Admitting: Endocrinology

## 2018-08-30 DIAGNOSIS — C73 Malignant neoplasm of thyroid gland: Secondary | ICD-10-CM | POA: Insufficient documentation

## 2018-08-30 DIAGNOSIS — E890 Postprocedural hypothyroidism: Secondary | ICD-10-CM | POA: Diagnosis not present

## 2018-08-30 DIAGNOSIS — Z85850 Personal history of malignant neoplasm of thyroid: Secondary | ICD-10-CM | POA: Diagnosis not present

## 2018-08-30 MED ORDER — SODIUM IODIDE I 131 CAPSULE
4.0000 | Freq: Once | INTRAVENOUS | Status: AC | PRN
  Administered 2018-08-28: 4 via ORAL

## 2018-09-16 DIAGNOSIS — N766 Ulceration of vulva: Secondary | ICD-10-CM | POA: Diagnosis not present

## 2018-09-16 DIAGNOSIS — N764 Abscess of vulva: Secondary | ICD-10-CM | POA: Diagnosis not present

## 2018-09-16 DIAGNOSIS — N9089 Other specified noninflammatory disorders of vulva and perineum: Secondary | ICD-10-CM | POA: Diagnosis not present

## 2018-09-18 ENCOUNTER — Emergency Department (HOSPITAL_COMMUNITY): Payer: BLUE CROSS/BLUE SHIELD

## 2018-09-18 ENCOUNTER — Other Ambulatory Visit: Payer: Self-pay

## 2018-09-18 ENCOUNTER — Encounter (HOSPITAL_COMMUNITY): Payer: Self-pay | Admitting: *Deleted

## 2018-09-18 ENCOUNTER — Inpatient Hospital Stay (HOSPITAL_COMMUNITY)
Admission: EM | Admit: 2018-09-18 | Discharge: 2018-09-23 | DRG: 156 | Disposition: A | Discharge status: Home / Self Care | Payer: BLUE CROSS/BLUE SHIELD | Attending: Internal Medicine | Admitting: Internal Medicine

## 2018-09-18 DIAGNOSIS — R338 Other retention of urine: Secondary | ICD-10-CM | POA: Diagnosis not present

## 2018-09-18 DIAGNOSIS — Z85850 Personal history of malignant neoplasm of thyroid: Secondary | ICD-10-CM

## 2018-09-18 DIAGNOSIS — A4150 Gram-negative sepsis, unspecified: Secondary | ICD-10-CM | POA: Diagnosis not present

## 2018-09-18 DIAGNOSIS — L03818 Cellulitis of other sites: Secondary | ICD-10-CM

## 2018-09-18 DIAGNOSIS — Z818 Family history of other mental and behavioral disorders: Secondary | ICD-10-CM | POA: Diagnosis not present

## 2018-09-18 DIAGNOSIS — B001 Herpesviral vesicular dermatitis: Secondary | ICD-10-CM | POA: Diagnosis not present

## 2018-09-18 DIAGNOSIS — L03315 Cellulitis of perineum: Secondary | ICD-10-CM | POA: Diagnosis not present

## 2018-09-18 DIAGNOSIS — Z83511 Family history of glaucoma: Secondary | ICD-10-CM

## 2018-09-18 DIAGNOSIS — Z801 Family history of malignant neoplasm of trachea, bronchus and lung: Secondary | ICD-10-CM | POA: Diagnosis not present

## 2018-09-18 DIAGNOSIS — Z79899 Other long term (current) drug therapy: Secondary | ICD-10-CM | POA: Diagnosis not present

## 2018-09-18 DIAGNOSIS — R291 Meningismus: Secondary | ICD-10-CM

## 2018-09-18 DIAGNOSIS — Z79890 Hormone replacement therapy (postmenopausal): Secondary | ICD-10-CM

## 2018-09-18 DIAGNOSIS — C73 Malignant neoplasm of thyroid gland: Secondary | ICD-10-CM | POA: Diagnosis present

## 2018-09-18 DIAGNOSIS — R51 Headache: Secondary | ICD-10-CM | POA: Diagnosis not present

## 2018-09-18 DIAGNOSIS — N762 Acute vulvitis: Secondary | ICD-10-CM | POA: Diagnosis not present

## 2018-09-18 DIAGNOSIS — G039 Meningitis, unspecified: Secondary | ICD-10-CM

## 2018-09-18 DIAGNOSIS — Z791 Long term (current) use of non-steroidal anti-inflammatories (NSAID): Secondary | ICD-10-CM

## 2018-09-18 DIAGNOSIS — E876 Hypokalemia: Secondary | ICD-10-CM | POA: Diagnosis present

## 2018-09-18 DIAGNOSIS — Z538 Procedure and treatment not carried out for other reasons: Secondary | ICD-10-CM | POA: Diagnosis not present

## 2018-09-18 DIAGNOSIS — Z833 Family history of diabetes mellitus: Secondary | ICD-10-CM

## 2018-09-18 DIAGNOSIS — Z0389 Encounter for observation for other suspected diseases and conditions ruled out: Secondary | ICD-10-CM | POA: Diagnosis not present

## 2018-09-18 DIAGNOSIS — K219 Gastro-esophageal reflux disease without esophagitis: Secondary | ICD-10-CM | POA: Diagnosis not present

## 2018-09-18 DIAGNOSIS — F988 Other specified behavioral and emotional disorders with onset usually occurring in childhood and adolescence: Secondary | ICD-10-CM | POA: Diagnosis present

## 2018-09-18 DIAGNOSIS — E890 Postprocedural hypothyroidism: Secondary | ICD-10-CM | POA: Diagnosis not present

## 2018-09-18 DIAGNOSIS — R509 Fever, unspecified: Secondary | ICD-10-CM | POA: Diagnosis present

## 2018-09-18 LAB — CBC
HCT: 38.5 % (ref 36.0–46.0)
Hemoglobin: 12.3 g/dL (ref 12.0–15.0)
MCH: 29.9 pg (ref 26.0–34.0)
MCHC: 31.9 g/dL (ref 30.0–36.0)
MCV: 93.7 fL (ref 80.0–100.0)
Platelets: 199 10*3/uL (ref 150–400)
RBC: 4.11 MIL/uL (ref 3.87–5.11)
RDW: 12.2 % (ref 11.5–15.5)
WBC: 13.5 10*3/uL — ABNORMAL HIGH (ref 4.0–10.5)
nRBC: 0 % (ref 0.0–0.2)

## 2018-09-18 LAB — BASIC METABOLIC PANEL
Anion gap: 13 (ref 5–15)
BUN: 9 mg/dL (ref 6–20)
CO2: 18 mmol/L — ABNORMAL LOW (ref 22–32)
Calcium: 8.7 mg/dL — ABNORMAL LOW (ref 8.9–10.3)
Chloride: 105 mmol/L (ref 98–111)
Creatinine, Ser: 0.72 mg/dL (ref 0.44–1.00)
GFR calc Af Amer: >60 mL/min (ref >60)
GFR calc non Af Amer: >60 mL/min (ref >60)
Glucose, Bld: 86 mg/dL (ref 70–99)
Potassium: 3.2 mmol/L — ABNORMAL LOW (ref 3.5–5.1)
Sodium: 136 mmol/L (ref 135–145)

## 2018-09-18 LAB — I-STAT BETA HCG BLOOD, ED (MC, WL, AP ONLY): I-stat hCG, quantitative: <5 m[IU]/mL (ref <5)

## 2018-09-18 LAB — LACTIC ACID, PLASMA: Lactic Acid, Venous: 0.9 mmol/L (ref 0.5–1.9)

## 2018-09-18 MED ORDER — VANCOMYCIN HCL IN DEXTROSE 1-5 GM/200ML-% IV SOLN
1000.0000 mg | Freq: Two times a day (BID) | INTRAVENOUS | Status: DC
  Administered 2018-09-19: 1000 mg via INTRAVENOUS
  Filled 2018-09-18 (×2): qty 200

## 2018-09-18 MED ORDER — SODIUM CHLORIDE 0.9 % IV SOLN
2.0000 g | Freq: Once | INTRAVENOUS | Status: AC
  Administered 2018-09-18: 2 g via INTRAVENOUS
  Filled 2018-09-18: qty 20

## 2018-09-18 MED ORDER — NAPROXEN 250 MG PO TABS
375.0000 mg | ORAL_TABLET | Freq: Two times a day (BID) | ORAL | Status: DC
  Administered 2018-09-18 – 2018-09-23 (×10): 375 mg via ORAL
  Filled 2018-09-18 (×10): qty 2

## 2018-09-18 MED ORDER — DIPHENHYDRAMINE HCL 50 MG/ML IJ SOLN
12.5000 mg | Freq: Once | INTRAMUSCULAR | Status: AC
  Administered 2018-09-18: 12.5 mg via INTRAVENOUS

## 2018-09-18 MED ORDER — SODIUM CHLORIDE 0.9 % IV SOLN
2.0000 g | Freq: Two times a day (BID) | INTRAVENOUS | Status: DC
  Administered 2018-09-18: 2 g via INTRAVENOUS
  Filled 2018-09-18 (×2): qty 2

## 2018-09-18 MED ORDER — ADULT MULTIVITAMIN W/MINERALS CH
1.0000 | ORAL_TABLET | Freq: Every day | ORAL | Status: DC
  Administered 2018-09-19 – 2018-09-23 (×5): 1 via ORAL
  Filled 2018-09-18 (×5): qty 1

## 2018-09-18 MED ORDER — SODIUM CHLORIDE 0.9 % IV BOLUS
1000.0000 mL | Freq: Once | INTRAVENOUS | Status: AC
  Administered 2018-09-18: 1000 mL via INTRAVENOUS

## 2018-09-18 MED ORDER — FENTANYL CITRATE (PF) 100 MCG/2ML IJ SOLN
25.0000 ug | Freq: Once | INTRAMUSCULAR | Status: DC
  Filled 2018-09-18: qty 2

## 2018-09-18 MED ORDER — IOHEXOL 300 MG/ML  SOLN
100.0000 mL | Freq: Once | INTRAMUSCULAR | Status: AC | PRN
  Administered 2018-09-18: 100 mL via INTRAVENOUS

## 2018-09-18 MED ORDER — NORETHIN ACE-ETH ESTRAD-FE 1-20 MG-MCG(24) PO TABS: 1.0000 | ORAL_TABLET | Freq: Every day | ORAL | Status: DC

## 2018-09-18 MED ORDER — LEVOTHYROXINE SODIUM 25 MCG PO TABS
137.0000 ug | ORAL_TABLET | Freq: Every day | ORAL | Status: DC
  Administered 2018-09-19 – 2018-09-23 (×5): 137 ug via ORAL
  Filled 2018-09-18 (×6): qty 1

## 2018-09-18 MED ORDER — FENTANYL CITRATE (PF) 100 MCG/2ML IJ SOLN
12.5000 ug | Freq: Once | INTRAMUSCULAR | Status: AC
  Administered 2018-09-18: 12.5 ug via INTRAVENOUS

## 2018-09-18 MED ORDER — VANCOMYCIN HCL IN DEXTROSE 1-5 GM/200ML-% IV SOLN
1000.0000 mg | Freq: Once | INTRAVENOUS | Status: AC
  Administered 2018-09-18: 1000 mg via INTRAVENOUS
  Filled 2018-09-18: qty 200

## 2018-09-18 MED ORDER — KETOROLAC TROMETHAMINE 15 MG/ML IJ SOLN
15.0000 mg | Freq: Once | INTRAMUSCULAR | Status: AC
  Administered 2018-09-18: 15 mg via INTRAVENOUS
  Filled 2018-09-18: qty 1

## 2018-09-18 MED ORDER — ACETAMINOPHEN 500 MG PO TABS
500.0000 mg | ORAL_TABLET | Freq: Four times a day (QID) | ORAL | Status: DC | PRN
  Administered 2018-09-19 – 2018-09-22 (×5): 500 mg via ORAL
  Filled 2018-09-18 (×5): qty 1

## 2018-09-18 MED ORDER — PROCHLORPERAZINE EDISYLATE 10 MG/2ML IJ SOLN
10.0000 mg | Freq: Once | INTRAMUSCULAR | Status: DC
  Filled 2018-09-18: qty 2

## 2018-09-18 MED ORDER — HYDROMORPHONE HCL 1 MG/ML IJ SOLN: 1.0000 mg | INTRAMUSCULAR | Status: DC | PRN

## 2018-09-18 MED ORDER — SODIUM CHLORIDE 0.9 % IV SOLN
INTRAVENOUS | Status: DC
  Administered 2018-09-18 – 2018-09-19 (×2): via INTRAVENOUS

## 2018-09-18 MED ORDER — PROCHLORPERAZINE EDISYLATE 10 MG/2ML IJ SOLN
5.0000 mg | Freq: Once | INTRAMUSCULAR | Status: AC
  Administered 2018-09-18: 5 mg via INTRAVENOUS

## 2018-09-18 MED ORDER — DIPHENHYDRAMINE HCL 50 MG/ML IJ SOLN
25.0000 mg | Freq: Once | INTRAMUSCULAR | Status: DC
  Filled 2018-09-18: qty 1

## 2018-09-18 MED ORDER — BENZONATATE 100 MG PO CAPS: 100.0000 mg | ORAL_CAPSULE | Freq: Two times a day (BID) | ORAL | Status: DC | PRN

## 2018-09-18 NOTE — Progress Notes (Signed)
New Admission Note:  Arrival Method: Via stretcher from ED Mental Orientation: Alert & Oriented x4 Telemetry: Not ordered  Assessment: Completed Skin: Refer to flowsheet IV: Left AC Pain: 5/10 Tubes: Safety Measures: Safety Fall Prevention Plan discussed with patient. Admission: Completed 5 [place] Orientation: Patient has been orientated to the room, unit and the staff. Family: Husband at the bedside   Orders have been reviewed and are being implemented. Will continue to monitor the patient. Call light has been placed within reach.   Vassie Moselle, RN  Phone Number: [phone]

## 2018-09-18 NOTE — ED Notes (Signed)
Blood cultures collected at 1620 before antibiotic administration.

## 2018-09-18 NOTE — ED Notes (Signed)
Pt given sprite and chicken broth to eat and drink.

## 2018-09-18 NOTE — ED Notes (Signed)
Pt. Aware that urine is needed. Stated they are unable to provide any at this time.

## 2018-09-18 NOTE — ED Triage Notes (Signed)
Pt reports fever, headache, generalized malaise, and herpes breakout.

## 2018-09-18 NOTE — ED Notes (Signed)
2nd lactic not needed per EDP

## 2018-09-18 NOTE — ED Notes (Signed)
ED TO INPATIENT HANDOFF REPORT  ED Nurse Name and Phone #: Nada Maclachlan  S Name/Age/Gender Donna Mueller 28 y.o. female Room/Bed: 014C/014C  Code Status   Code Status: Full Code  Home/SNF/Other Home Patient oriented to: self, place, time and situation Is this baseline? Yes   Triage Complete: Triage complete  Chief Complaint POSS FLU SX  Triage Note Pt reports fever, headache, generalized malaise, and herpes breakout.   Allergies No Known Allergies  Level of Care/Admitting Diagnosis ED Disposition    ED Disposition Condition [facility] Area: [place] [[id number]]  Level of Care: Med-Surg [16]  Diagnosis: Sepsis, Gram negative ([place]) [[id number]]  Admitting Physician: Elwyn Reach [[id number]]  Attending Physician: Elwyn Reach [[id number]]  Estimated length of stay: past midnight tomorrow  Certification:: I certify this patient will need inpatient services for at least 2 midnights  PT Class (Do Not Modify): Inpatient [101]  PT Acc Code (Do Not Modify): Private [1]       B Medical/Surgery History Past Medical History:  Diagnosis Date  . ADD (attention deficit disorder)   . Difficulty sleeping   . GERD (gastroesophageal reflux disease)   . Headache    HX MIGRAINES  . Knee pain    LEFT  . Papillary thyroid carcinoma [facility])    Past Surgical History:  Procedure Laterality Date  . LYMPH NODE DISSECTION N/A 07/20/2015   Procedure: LYMPH NODE DISSECTION;  Surgeon: Armandina Gemma, MD;  Location: [facility];  Service: General;  Laterality: N/A;  . THYROIDECTOMY N/A 07/20/2015   Procedure: TOTAL THYROIDECTOMY WITH LIMITED CENTRAL COMPARTMENT  LYMPH NODE DISSECTION  AND AUTO-TRANSPLANTATION OF LEFT INFERIOR PARATHYROID ;  Surgeon: Armandina Gemma, MD;  Location: [facility];  Service: General;  Laterality: N/A;     A IV Location/Drains/Wounds Patient Lines/Drains/Airways Status   Active Line/Drains/Airways    Name:   Placement date:   Placement  time:   Site:   Days:   Peripheral IV 09/18/18 Left Antecubital   09/18/18    1614    Antecubital   less than 1   Incision (Closed) 07/20/15 Neck   07/20/15    0910     1156          Intake/Output Last 24 hours  Intake/Output Summary (Last 24 hours) at 09/18/2018 2017 Last data filed at 09/18/2018 1758 Gross per 24 hour  Intake 2291 ml  Output -  Net 2291 ml    Labs/Imaging Results for orders placed or performed during the hospital encounter of 09/18/18 (from the past 48 hour(s))  Lactic acid, plasma     Status: None   Collection Time: 09/18/18  3:42 PM  Result Value Ref Range   Lactic Acid, Venous 0.9 0.5 - 1.9 mmol/L    Comment: Performed at [facility], [address] [place], [place] [address]  CBC     Status: Abnormal   Collection Time: 09/18/18  4:20 PM  Result Value Ref Range   WBC 13.5 (H) 4.0 - 10.5 K/uL   RBC 4.11 3.87 - 5.11 MIL/uL   Hemoglobin 12.3 12.0 - 15.0 g/dL   HCT 38.5 36.0 - 46.0 %   MCV 93.7 80.0 - 100.0 fL   MCH 29.9 26.0 - 34.0 pg   MCHC 31.9 30.0 - 36.0 g/dL   RDW 12.2 11.5 - 15.5 %   Platelets 199 150 - 400 K/uL   nRBC 0.0 0.0 - 0.2 %  Comment: Performed at [facility], [place] [address] [place], [place] [address]  Basic metabolic panel     Status: Abnormal   Collection Time: 09/18/18  4:20 PM  Result Value Ref Range   Sodium 136 135 - 145 mmol/L   Potassium 3.2 (L) 3.5 - 5.1 mmol/L   Chloride 105 98 - 111 mmol/L   CO2 18 (L) 22 - 32 mmol/L   Glucose, Bld 86 70 - 99 mg/dL   BUN 9 6 - 20 mg/dL   Creatinine, Ser 0.72 0.44 - 1.00 mg/dL   Calcium 8.7 (L) 8.9 - 10.3 mg/dL   GFR calc non Af Amer >60 >60 mL/min   GFR calc Af Amer >60 >60 mL/min   Anion gap 13 5 - 15    Comment: Performed at [facility], [place] [address] [place], [place] [address]  Wound or Superficial Culture     Status: None (Preliminary result)   Collection Time: 09/18/18  4:28 PM  Result Value Ref Range   Specimen Description LABIA    Special Requests  NONE    Gram Stain      ABUNDANT WBC PRESENT, PREDOMINANTLY PMN FEW GRAM VARIABLE ROD Performed at [facility], [place] [address], [place], [place] [address]    Culture PENDING    Report Status PENDING   I-Stat Beta hCG blood, ED ([facility], [facility], [facility] only)     Status: None   Collection Time: 09/18/18  4:40 PM  Result Value Ref Range   I-stat hCG, quantitative <5.0 <5 mIU/mL   Comment 3            Comment:   GEST. AGE      CONC.  (mIU/mL)   <=1 WEEK        5 - 50     2 WEEKS       50 - 500     3 WEEKS       100 - 10,000     4 WEEKS     1,000 - 30,000        FEMALE AND NON-PREGNANT FEMALE:     LESS THAN 5 mIU/mL    Ct Pelvis W Contrast  Result Date: 09/18/2018 CLINICAL DATA:  Perineal cellulitis EXAM: CT PELVIS WITH CONTRAST TECHNIQUE: Multidetector CT imaging of the pelvis was performed using the standard protocol following the bolus administration of intravenous contrast. CONTRAST:  166mL OMNIPAQUE IOHEXOL 300 MG/ML  SOLN COMPARISON:  None. FINDINGS: Urinary Tract:  No abnormality visualized. Bowel:  Unremarkable visualized pelvic bowel loops. Vascular/Lymphatic: No pathologically enlarged lymph nodes. No significant vascular abnormality seen. Reproductive: There is mild subcutaneous induration of the anterior labia majora and inferior mons pubis without underlying collection or soft tissue gas. There is fluid within the vaginal vault. The uterus and ovaries are normal. Other:  None. Musculoskeletal: Normal IMPRESSION: Mild subcutaneous induration of the anterior labia majora and inferior mons pubis without abscess or soft tissue gas. Electronically Signed   By: Ulyses Jarred M.D.   On: 09/18/2018 18:55    Pending Labs Unresulted Labs (From admission, onward)    Start     Ordered   09/19/18 0500  Comprehensive metabolic panel  Tomorrow morning,   R     09/18/18 2003   09/19/18 0500  CBC  Tomorrow morning,   R     09/18/18 2003   09/18/18 2004  HIV antibody (Routine Testing)  Once,   R  09/18/18 2003   09/18/18 1543  Blood culture (routine x 2)  BLOOD CULTURE X 2,   STAT     09/18/18 1542   09/18/18 1542  Urinalysis, Routine w reflex microscopic  ONCE - STAT,   R     09/18/18 1541          Vitals/Pain Today's Vitals   09/18/18 1528 09/18/18 1656 09/18/18 1828 09/18/18 1959  BP:  108/70 (!) 112/59 101/60  Pulse:  (!) 118 (!) 122 (!) 113  Resp:  18 14 16   Temp:   (!) 100.9 F (38.3 C) 99.7 F (37.6 C)  TempSrc:   Oral Oral  SpO2:  100% 99% 98%  PainSc: 9        Isolation Precautions No active isolations  Medications Medications  naproxen (NAPROSYN) tablet 375 mg (has no administration in time range)  levothyroxine (SYNTHROID, LEVOTHROID) tablet 137 mcg (has no administration in time range)  Norethindrone Acetate-Ethinyl Estrad-FE (LOESTRIN 24 FE) 1-20 MG-MCG(24) tablet 1 tablet (has no administration in time range)  multivitamin tablet 1 tablet (has no administration in time range)  benzonatate (TESSALON) capsule 100 mg (has no administration in time range)  acetaminophen (TYLENOL) tablet 500 mg (has no administration in time range)  0.9 %  sodium chloride infusion (has no administration in time range)  HYDROmorphone (DILAUDID) injection 1 mg (has no administration in time range)  ceFEPIme (MAXIPIME) 2 g in sodium chloride 0.9 % 100 mL IVPB (has no administration in time range)  vancomycin (VANCOCIN) IVPB 1000 mg/200 mL premix (has no administration in time range)  sodium chloride 0.9 % bolus 1,000 mL (0 mLs Intravenous Stopped 09/18/18 1758)  sodium chloride 0.9 % bolus 1,000 mL (0 mLs Intravenous Stopped 09/18/18 1758)  vancomycin (VANCOCIN) IVPB 1000 mg/200 mL premix ( Intravenous Stopped 09/18/18 1755)  cefTRIAXone (ROCEPHIN) 2 g in sodium chloride 0.9 % 100 mL IVPB ( Intravenous Stopped 09/18/18 1650)  diphenhydrAMINE (BENADRYL) injection 12.5 mg (12.5 mg Intravenous Given 09/18/18 1605)  prochlorperazine (COMPAZINE) injection 5 mg (5 mg Intravenous  Given 09/18/18 1605)  fentaNYL (SUBLIMAZE) injection 12.5 mcg (12.5 mcg Intravenous Given 09/18/18 1606)  iohexol (OMNIPAQUE) 300 MG/ML solution 100 mL (100 mLs Intravenous Contrast Given 09/18/18 1824)  ketorolac (TORADOL) 15 MG/ML injection 15 mg (15 mg Intravenous Given 09/18/18 1854)    Mobility walks Low fall risk   Focused Assessments    R Recommendations: See Admitting Provider Note  Report given to:   Additional Notes:

## 2018-09-18 NOTE — Progress Notes (Signed)
Pharmacy Antibiotic Note  Donna Mueller is a 28 y.o. female admitted on 09/18/2018 with sepsis/cellulitis.  Pharmacy has been consulted for cefepime and vancomycin dosing. WBC 13.5. LA 0.9. SCr wnl   Plan: -Cefepime 2 g gm IV Q 12 hours  -Vancomycin 1 gm IV Q 12 hours  -Monitor CBC, renal fx, cultures and clinical progress -VT at SS      Temp (24hrs), Avg:100.6 F (38.1 C), Min:100.2 F (37.9 C), Max:100.9 F (38.3 C)  Recent Labs  Lab 09/18/18 1542 09/18/18 1620  WBC  --  13.5*  CREATININE  --  0.72  LATICACIDVEN 0.9  --     CrCl cannot be calculated (Unknown ideal weight.).    No Known Allergies   Thank you for allowing pharmacy to be a part of this patient's care.  Albertina Parr, PharmD., BCPS Clinical Pharmacist Clinical phone for 09/18/18 until 10:30pm: [phone] If after 10:30pm, please refer to [facility] for unit-specific pharmacist

## 2018-09-18 NOTE — H&P (Signed)
History and Physical   Donna Mueller YKD:[id number] DOB: [birth year] DOA: 09/18/2018  Referring MD/NP/PA: Dr. Sedonia Small  PCP: Lutricia Feil, PA   Patient coming from: Home  Chief Complaint: Vaginal pain and swelling  HPI: Donna Mueller is a 28 y.o. female with medical history significant of thyroid cancer status post thyroidectomy who was seen last week with vaginal lesion diagnosed by her gynecologist as herpes simplex infection.  Patient started on Zovirax as well as Keflex.  She has been taking the medication but has gotten worse.  Her whole labia is swollen and oozing fluids.  She has open crater with foul-smelling discharge.  She came to the ER where she was seen and evaluated.  Patient has significant cellulitis involving the whole vulvar area on the inner part outside her vaginal area.  Patient is therefore being admitted to the hospital with worsening cellulitis.  No evidence of collection of abscess.  She does have some fever.  Patient having pain rated as 10 out of 10.  She had no known immunosuppression.  No previous history of MRSA or any cellulitis..  ED Course: Temperature 100.9 blood pressure 112/59 pulse 135 respirate of 18 oxygen sat 98% room air. Sodium is 137 potassium 3.2 chloride 105 with a CO2 of 18.  White count is 13.5 otherwise rest of the blood work is within normal.  CT pelvis showed induration of the labia but no obvious abscess.  Patient initiated on vancomycin and Rocephin and being admitted to the hospital.  Review of Systems: As per HPI otherwise 10 point review of systems negative.    Past Medical History:  Diagnosis Date  . ADD (attention deficit disorder)   . Difficulty sleeping   . GERD (gastroesophageal reflux disease)   . Headache    HX MIGRAINES  . Knee pain    LEFT  . Papillary thyroid carcinoma [facility])     Past Surgical History:  Procedure Laterality Date  . LYMPH NODE DISSECTION N/A 07/20/2015   Procedure: LYMPH NODE DISSECTION;  Surgeon:  Armandina Gemma, MD;  Location: [facility];  Service: General;  Laterality: N/A;  . THYROIDECTOMY N/A 07/20/2015   Procedure: TOTAL THYROIDECTOMY WITH LIMITED CENTRAL COMPARTMENT  LYMPH NODE DISSECTION  AND AUTO-TRANSPLANTATION OF LEFT INFERIOR PARATHYROID ;  Surgeon: Armandina Gemma, MD;  Location: [facility];  Service: General;  Laterality: N/A;     reports that she has never smoked. She has never used smokeless tobacco. She reports current alcohol use. She reports that she does not use drugs.  No Known Allergies  Family History  Problem Relation Age of Onset  . Glaucoma Mother   . Anxiety disorder Father   . Healthy Brother   . Thyroid disease Paternal Uncle   . Lung cancer Maternal Grandmother   . Diabetes Maternal Grandfather      Prior to Admission medications   Medication Sig Start Date End Date Taking? Authorizing Provider  acetaminophen (TYLENOL) 500 MG tablet Take 500 mg by mouth every 6 (six) hours as needed.    [provider]  benzonatate (TESSALON) 100 MG capsule Take 1 capsule (100 mg total) by mouth 2 (two) times daily as needed for cough. 07/12/17   Leamon Arnt, MD  JUNEL FE 24 1-20 MG-MCG(24) tablet TK 1 T PO  EVERY DAY 07/08/17   [provider]  Multiple Vitamin (MULTIVITAMIN) tablet Take 1 tablet by mouth daily.    [provider]  naproxen (NAPROSYN) 375 MG tablet Take 1 tablet (  375 mg total) by mouth 2 (two) times daily. 01/22/16   Malvin Johns, MD  SYNTHROID 137 MCG tablet Take 137 mcg by mouth daily. 04/17/17   [provider]    Physical Exam: Vitals:   09/18/18 1455 09/18/18 1656 09/18/18 1828  BP: (!) 111/59 108/70 (!) 112/59  Pulse: (!) 135 (!) 118 (!) 122  Resp: 18 18 14   Temp: 100.2 F (37.9 C)  (!) 100.9 F (38.3 C)  TempSrc: Oral  Oral  SpO2: 99% 100% 99%      Constitutional: NAD, calm, comfortable Vitals:   09/18/18 1455 09/18/18 1656 09/18/18 1828  BP: (!) 111/59 108/70 (!) 112/59  Pulse: (!) 135 (!) 118 (!) 122   Resp: 18 18 14   Temp: 100.2 F (37.9 C)  (!) 100.9 F (38.3 C)  TempSrc: Oral  Oral  SpO2: 99% 100% 99%   Eyes: PERRL, lids and conjunctivae normal ENMT: Mucous membranes are moist. Posterior pharynx clear of any exudate or lesions.Normal dentition.  Neck: normal, supple, no masses, no thyromegaly Respiratory: clear to auscultation bilaterally, no wheezing, no crackles. Normal respiratory effort. No accessory muscle use.  Cardiovascular: Regular rate and rhythm, no murmurs / rubs / gallops. No extremity edema. 2+ pedal pulses. No carotid bruits.  Abdomen: no tenderness, no masses palpated. No hepatosplenomegaly. Bowel sounds positive.  Musculoskeletal: no clubbing / cyanosis. No joint deformity upper and lower extremities. Good ROM, no contractures. Normal muscle tone.  Skin: Vulvar induration, Discharge and ulcer in vaginal opening Neurologic: CN 2-12 grossly intact. Sensation intact, DTR normal. Strength 5/5 in all 4.  Psychiatric: Normal judgment and insight. Alert and oriented x 3. Normal mood.     Labs on Admission: I have personally reviewed following labs and imaging studies  CBC: Recent Labs  Lab 09/18/18 1620  WBC 13.5*  HGB 12.3  HCT 38.5  MCV 93.7  PLT 672   Basic Metabolic Panel: Recent Labs  Lab 09/18/18 1620  NA 136  K 3.2*  CL 105  CO2 18*  GLUCOSE 86  BUN 9  CREATININE 0.72  CALCIUM 8.7*   GFR: CrCl cannot be calculated (Unknown ideal weight.). Liver Function Tests: No results for input(s): AST, ALT, ALKPHOS, BILITOT, PROT, ALBUMIN in the last 168 hours. No results for input(s): LIPASE, AMYLASE in the last 168 hours. No results for input(s): AMMONIA in the last 168 hours. Coagulation Profile: No results for input(s): INR, PROTIME in the last 168 hours. Cardiac Enzymes: No results for input(s): CKTOTAL, CKMB, CKMBINDEX, TROPONINI in the last 168 hours. BNP (last 3 results) No results for input(s): PROBNP in the last 8760 hours. HbA1C: No  results for input(s): HGBA1C in the last 72 hours. CBG: No results for input(s): GLUCAP in the last 168 hours. Lipid Profile: No results for input(s): CHOL, HDL, LDLCALC, TRIG, CHOLHDL, LDLDIRECT in the last 72 hours. Thyroid Function Tests: No results for input(s): TSH, T4TOTAL, FREET4, T3FREE, THYROIDAB in the last 72 hours. Anemia Panel: No results for input(s): VITAMINB12, FOLATE, FERRITIN, TIBC, IRON, RETICCTPCT in the last 72 hours. Urine analysis: No results found for: COLORURINE, APPEARANCEUR, LABSPEC, PHURINE, GLUCOSEU, HGBUR, BILIRUBINUR, KETONESUR, PROTEINUR, UROBILINOGEN, NITRITE, LEUKOCYTESUR Sepsis Labs: @LABRCNTIP (procalcitonin:4,lacticidven:4) )No results found for this or any previous visit (from the past 240 hour(s)).   Radiological Exams on Admission: Ct Pelvis W Contrast  Result Date: 09/18/2018 CLINICAL DATA:  Perineal cellulitis EXAM: CT PELVIS WITH CONTRAST TECHNIQUE: Multidetector CT imaging of the pelvis was performed using the standard protocol following the bolus administration  of intravenous contrast. CONTRAST:  157mL OMNIPAQUE IOHEXOL 300 MG/ML  SOLN COMPARISON:  None. FINDINGS: Urinary Tract:  No abnormality visualized. Bowel:  Unremarkable visualized pelvic bowel loops. Vascular/Lymphatic: No pathologically enlarged lymph nodes. No significant vascular abnormality seen. Reproductive: There is mild subcutaneous induration of the anterior labia majora and inferior mons pubis without underlying collection or soft tissue gas. There is fluid within the vaginal vault. The uterus and ovaries are normal. Other:  None. Musculoskeletal: Normal IMPRESSION: Mild subcutaneous induration of the anterior labia majora and inferior mons pubis without abscess or soft tissue gas. Electronically Signed   By: Ulyses Jarred M.D.   On: 09/18/2018 18:55    Assessment/Plan Principal Problem:   Sepsis, Gram negative (HCC) Active Problems:   Papillary thyroid carcinoma (HCC)    Post-surgical hypothyroidism   Vulvar cellulitis   Herpes labialis     #1 cellulitis of the labia and vulva: Probably secondary infection after herpes simplex infection with ulcer.  Patient will be admitted.  Cultures will be obtained.  IV vancomycin and cefepime.  Monitor response.  If abscess appears to have formed we may get gynecology consult for incision and drainage.  #2 hypothyroidism: Postsurgical.  Patient appears to be stable.  Levothyroxine from home.  #3 herpes labialis: Continue with valacyclovir to complete treatment.  #4 ADD: Continue home regimen.  #5 GERD: Continue PPIs  #6 hypokalemia: Replete potassium   DVT prophylaxis: SCD Code Status: Full code Family Communication: Husband and sister at bedside Disposition Plan: Home Consults called: None Admission status: Inpatient  Severity of Illness: The appropriate patient status for this patient is INPATIENT. Inpatient status is judged to be reasonable and necessary in order to provide the required intensity of service to ensure the patient's safety. The patient's presenting symptoms, physical exam findings, and initial radiographic and laboratory data in the context of their chronic comorbidities is felt to place them at high risk for further clinical deterioration. Furthermore, it is not anticipated that the patient will be medically stable for discharge from the hospital within 2 midnights of admission. The following factors support the patient status of inpatient.   " The patient's presenting symptoms include pain and swelling of the vulva. " The worrisome physical exam findings include distended swollen vulvar area with discharge. " The initial radiographic and laboratory data are worrisome because of CT scan showing induration. " The chronic co-morbidities include hypothyroidism.   * I certify that at the point of admission it is my clinical judgment that the patient will require inpatient hospital care spanning  beyond 2 midnights from the point of admission due to high intensity of service, high risk for further deterioration and high frequency of surveillance required.Barbette Merino MD [facility] Pager [phone]  If 7PM-7AM, please contact night-coverage [email or web address] Password [facility]  09/18/2018, 7:46 PM

## 2018-09-18 NOTE — ED Provider Notes (Signed)
[facility] Emergency Department Provider Note MRN:  [id number]  Arrival date & time: 09/18/18     Chief Complaint   Rash, fever, headache History of Present Illness   Donna Mueller is a 28 y.o. year-old female with a history of papillary thyroid cancer presenting to the ED with chief complaint of rash, fever, headache.  Gradual onset of rash to the vaginal area 5 days ago, accompanied by gradual onset frontal headache that was improved with ibuprofen at home but has recurred.  Rash of the vaginal area has become severely painful.  Fever up to 101.5 at home today.  Was evaluated by GYN and preliminarily diagnosed with herpes or possibly a bacterial infection, has been taking Keflex and valacyclovir.  Continued malaise, fatigue, elevated heart rate, fever, significant pain, 9 out of 10 in severity, worse with motion.  Purulent discharge from the rash.  Denies nasal congestion, no sore throat, no cough, chest pain, shortness of breath, no abdominal pain.  Review of Systems  A complete 10 system review of systems was obtained and all systems are negative except as noted in the HPI and PMH.   Patient's Health History    Past Medical History:  Diagnosis Date  . ADD (attention deficit disorder)   . Difficulty sleeping   . GERD (gastroesophageal reflux disease)   . Headache    HX MIGRAINES  . Knee pain    LEFT  . Papillary thyroid carcinoma [facility])     Past Surgical History:  Procedure Laterality Date  . LYMPH NODE DISSECTION N/A 07/20/2015   Procedure: LYMPH NODE DISSECTION;  Surgeon: Armandina Gemma, MD;  Location: [facility];  Service: General;  Laterality: N/A;  . THYROIDECTOMY N/A 07/20/2015   Procedure: TOTAL THYROIDECTOMY WITH LIMITED CENTRAL COMPARTMENT  LYMPH NODE DISSECTION  AND AUTO-TRANSPLANTATION OF LEFT INFERIOR PARATHYROID ;  Surgeon: Armandina Gemma, MD;  Location: [facility];  Service: General;  Laterality: N/A;    Family History  Problem Relation Age of Onset  .  Glaucoma Mother   . Anxiety disorder Father   . Healthy Brother   . Thyroid disease Paternal Uncle   . Lung cancer Maternal Grandmother   . Diabetes Maternal Grandfather     Social History   Socioeconomic History  . Marital status: Married    Spouse name: Micheal Likens  . Number of children: 0  . Years of education: Not on file  . Highest education level: Not on file  Occupational History  . Occupation: Hair stylist, [employer]  Social Needs  . Financial resource strain: Not on file  . Food insecurity:    Worry: Not on file    Inability: Not on file  . Transportation needs:    Medical: Not on file    Non-medical: Not on file  Tobacco Use  . Smoking status: Never Smoker  . Smokeless tobacco: Never Used  Substance and Sexual Activity  . Alcohol use: Yes    Comment: OCCASIONAL  . Drug use: No  . Sexual activity: Yes    Birth control/protection: Pill  Lifestyle  . Physical activity:    Days per week: Not on file    Minutes per session: Not on file  . Stress: Not on file  Relationships  . Social connections:    Talks on phone: Not on file    Gets together: Not on file    Attends religious service: Not on file    Active member of club or organization: Not on file  Attends meetings of clubs or organizations: Not on file    Relationship status: Not on file  . Intimate partner violence:    Fear of current or ex partner: Not on file    Emotionally abused: Not on file    Physically abused: Not on file    Forced sexual activity: Not on file  Other Topics Concern  . Not on file  Social History Narrative  . Not on file     Physical Exam  Vital Signs and Nursing Notes reviewed Vitals:   09/18/18 1656 09/18/18 1828  BP: 108/70 (!) 112/59  Pulse: (!) 118 (!) 122  Resp: 18 14  Temp:  (!) 100.9 F (38.3 C)  SpO2: 100% 99%    CONSTITUTIONAL: Well-appearing, NAD NEURO:  Alert and oriented x 3, no focal deficits EYES:  eyes equal and reactive ENT/NECK:  no LAD, no JVD  CARDIO: Tachycardic rate, well-perfused, normal S1 and S2 PULM:  CTAB no wheezing or rhonchi GI/GU:  normal bowel sounds, non-distended, non-tender; diffusely edematous and indurated labia majora, labia minora and majora with multiple scattered wounds/craters of the skin filled with purulent material MSK/SPINE:  No gross deformities, no edema SKIN:  no rash, atraumatic PSYCH:  Appropriate speech and behavior  Diagnostic and Interventional Summary    Labs Reviewed  CBC - Abnormal; Notable for the following components:      Result Value   WBC 13.5 (*)    All other components within normal limits  BASIC METABOLIC PANEL - Abnormal; Notable for the following components:   Potassium 3.2 (*)    CO2 18 (*)    Calcium 8.7 (*)    All other components within normal limits  CULTURE, BLOOD (ROUTINE X 2)  CULTURE, BLOOD (ROUTINE X 2)  AEROBIC CULTURE (SUPERFICIAL SPECIMEN)  LACTIC ACID, PLASMA  URINALYSIS, ROUTINE W REFLEX MICROSCOPIC  I-STAT BETA HCG BLOOD, ED ([facility], [facility], [facility] ONLY)  GC/CHLAMYDIA PROBE AMP ([place]) NOT AT [facility]    CT PELVIS W CONTRAST    (Results Pending)    Medications  sodium chloride 0.9 % bolus 1,000 mL (0 mLs Intravenous Stopped 09/18/18 1758)  sodium chloride 0.9 % bolus 1,000 mL (0 mLs Intravenous Stopped 09/18/18 1758)  vancomycin (VANCOCIN) IVPB 1000 mg/200 mL premix ( Intravenous Stopped 09/18/18 1755)  cefTRIAXone (ROCEPHIN) 2 g in sodium chloride 0.9 % 100 mL IVPB ( Intravenous Stopped 09/18/18 1650)  diphenhydrAMINE (BENADRYL) injection 12.5 mg (12.5 mg Intravenous Given 09/18/18 1605)  prochlorperazine (COMPAZINE) injection 5 mg (5 mg Intravenous Given 09/18/18 1605)  fentaNYL (SUBLIMAZE) injection 12.5 mcg (12.5 mcg Intravenous Given 09/18/18 1606)  iohexol (OMNIPAQUE) 300 MG/ML solution 100 mL (100 mLs Intravenous Contrast Given 09/18/18 1824)  ketorolac (TORADOL) 15 MG/ML injection 15 mg (15 mg Intravenous Given 09/18/18 1854)     Procedures Critical Care  Critical Care Documentation Critical care time provided by me (excluding procedures): 38 minutes  Condition necessitating critical care: Sepsis of soft tissue origin  Components of critical care management: reviewing of prior records, laboratory and imaging interpretation, frequent re-examination and reassessment of vital signs, administration of IV ceftriaxone, ceftriaxone, IV fluids, discussion with consulting services    ED Course and Medical Decision Making  I have reviewed the triage vital signs and the nursing notes.  Pertinent labs & imaging results that were available during my care of the patient were reviewed by me and considered in my medical decision making (see below for details).  Concern for bacterial infection of the vagina/perineum, with  patient's fever and heart rate there is concern for sepsis, code sepsis initiated.  Exquisitely tender on superficial exam, unable to tolerate bimanual exam.  Purulent material sent for culture.  Will need CT to exclude more significant deep space infection or necrotizing infection such as Fournier's gangrene.  IV fluids, Vanco and ceftriaxone.  Will screen for gonorrhea in the urine.  Labs reveal mild leukocytosis, mild hypokalemia.  Patient continues to be febrile and tachycardic here in the emergency department.  Continued normal neurological exam with no meningismus.  Will admit for cellulitis, CT pelvis still pending to exclude deeper space infection.  Signed out to provider Layden at shift change.  Barth Kirks. Sedonia Small, [place] [email or web address]  Final Clinical Impressions(s) / ED Diagnoses     ICD-10-CM   1. Cellulitis of other specified site L03.818     ED Discharge Orders    None         Maudie Flakes, MD 09/18/18 [phone]

## 2018-09-18 NOTE — ED Provider Notes (Signed)
  Care assumed from Gerlene Fee, MD at shift change with CT abd/pelvis.   In brief, this patient is a 28 y.o. F who presents for evaluation of rash to vaginal x 5 days. Patient reports fever at home today. Patient was initially seen for herpes and was treated accordingly.  Please see note from previous provider for full history/physical exam.  PLAN: Pending CT abdomen pelvis for evaluation of any soft tissue gas, surgical pathology.  Patient received antibiotics here in the ED.  MDM:  CT abdomen pelvis shows mild subcutaneous induration of the anterior labia majora and inferior mons pubis.  No evidence of abscess or soft tissue gas.  Patient got Vanco and ceftriaxone here in the ED.  Will plan for admission for cellulitis with sepsis.  Discussed patient with Dr. Jonelle Sidle (hospitalist). Will admit.    1. Cellulitis of other specified site       Desma Mcgregor 09/18/18 1918    Maudie Flakes, MD 09/19/18 1950

## 2018-09-19 ENCOUNTER — Inpatient Hospital Stay (HOSPITAL_COMMUNITY): Payer: BLUE CROSS/BLUE SHIELD

## 2018-09-19 DIAGNOSIS — R51 Headache: Secondary | ICD-10-CM

## 2018-09-19 DIAGNOSIS — B001 Herpesviral vesicular dermatitis: Principal | ICD-10-CM

## 2018-09-19 LAB — COMPREHENSIVE METABOLIC PANEL
ALK PHOS: 49 U/L (ref 38–126)
ALT: 7 U/L (ref 0–44)
ANION GAP: 8 (ref 5–15)
AST: 7 U/L — ABNORMAL LOW (ref 15–41)
Albumin: 2.4 g/dL — ABNORMAL LOW (ref 3.5–5.0)
BUN: 9 mg/dL (ref 6–20)
CALCIUM: 7.7 mg/dL — AB (ref 8.9–10.3)
CO2: 21 mmol/L — ABNORMAL LOW (ref 22–32)
Chloride: 110 mmol/L (ref 98–111)
Creatinine, Ser: 0.56 mg/dL (ref 0.44–1.00)
GFR calc Af Amer: >60 mL/min (ref >60)
GFR calc non Af Amer: >60 mL/min (ref >60)
Glucose, Bld: 82 mg/dL (ref 70–99)
Potassium: 3 mmol/L — ABNORMAL LOW (ref 3.5–5.1)
SODIUM: 139 mmol/L (ref 135–145)
Total Bilirubin: 0.5 mg/dL (ref 0.3–1.2)
Total Protein: 5.1 g/dL — ABNORMAL LOW (ref 6.5–8.1)

## 2018-09-19 LAB — CBC
HCT: 30.5 % — ABNORMAL LOW (ref 36.0–46.0)
Hemoglobin: 10 g/dL — ABNORMAL LOW (ref 12.0–15.0)
MCH: 31.1 pg (ref 26.0–34.0)
MCHC: 32.8 g/dL (ref 30.0–36.0)
MCV: 94.7 fL (ref 80.0–100.0)
PLATELETS: 180 10*3/uL (ref 150–400)
RBC: 3.22 MIL/uL — ABNORMAL LOW (ref 3.87–5.11)
RDW: 12.3 % (ref 11.5–15.5)
WBC: 11.2 10*3/uL — ABNORMAL HIGH (ref 4.0–10.5)
nRBC: 0 % (ref 0.0–0.2)

## 2018-09-19 LAB — URINALYSIS, ROUTINE W REFLEX MICROSCOPIC
Bilirubin Urine: NEGATIVE
Glucose, UA: NEGATIVE mg/dL
KETONES UR: 80 mg/dL — AB
Nitrite: NEGATIVE
Protein, ur: 100 mg/dL — AB
Specific Gravity, Urine: >1.046 — ABNORMAL HIGH (ref 1.005–1.030)
pH: 6 (ref 5.0–8.0)

## 2018-09-19 LAB — HIV ANTIBODY (ROUTINE TESTING W REFLEX): HIV Screen 4th Generation wRfx: NONREACTIVE

## 2018-09-19 MED ORDER — SODIUM CHLORIDE 0.9 % IV SOLN
100.0000 mg | Freq: Two times a day (BID) | INTRAVENOUS | Status: DC
  Administered 2018-09-19 – 2018-09-22 (×7): 100 mg via INTRAVENOUS
  Filled 2018-09-19 (×9): qty 100

## 2018-09-19 MED ORDER — NORETHIN ACE-ETH ESTRAD-FE 1-20 MG-MCG(24) PO TABS
1.0000 | ORAL_TABLET | Freq: Every day | ORAL | Status: DC
  Administered 2018-09-19 – 2018-09-22 (×4): 1 via ORAL

## 2018-09-19 MED ORDER — BUTALBITAL-APAP-CAFFEINE 50-325-40 MG PO TABS
1.0000 | ORAL_TABLET | Freq: Four times a day (QID) | ORAL | Status: DC | PRN
  Administered 2018-09-19 – 2018-09-20 (×3): 1 via ORAL
  Filled 2018-09-19 (×3): qty 1

## 2018-09-19 MED ORDER — SODIUM CHLORIDE 0.9 % IV SOLN
2.0000 g | INTRAVENOUS | Status: DC
  Administered 2018-09-19 – 2018-09-22 (×4): 2 g via INTRAVENOUS
  Filled 2018-09-19 (×4): qty 20

## 2018-09-19 MED ORDER — OXYCODONE HCL 5 MG PO TABS
5.0000 mg | ORAL_TABLET | ORAL | Status: DC | PRN
  Administered 2018-09-19 – 2018-09-23 (×4): 5 mg via ORAL
  Filled 2018-09-19 (×5): qty 1

## 2018-09-19 MED ORDER — LIDOCAINE HCL 4 % EX SOLN
Freq: Three times a day (TID) | CUTANEOUS | Status: DC | PRN
  Administered 2018-09-19: 15:00:00 via TOPICAL
  Administered 2018-09-20: 1 mL via TOPICAL
  Filled 2018-09-19: qty 50

## 2018-09-19 MED ORDER — LIDOCAINE HCL URETHRAL/MUCOSAL 2 % EX GEL
1.0000 "application " | Freq: Once | CUTANEOUS | Status: AC
  Administered 2018-09-19: 1 via URETHRAL
  Filled 2018-09-19 (×3): qty 5

## 2018-09-19 MED ORDER — ONDANSETRON HCL 4 MG/2ML IJ SOLN: 4.0000 mg | Freq: Four times a day (QID) | INTRAMUSCULAR | Status: DC | PRN

## 2018-09-19 MED ORDER — DOCUSATE SODIUM 100 MG PO CAPS
100.0000 mg | ORAL_CAPSULE | Freq: Two times a day (BID) | ORAL | Status: DC
  Administered 2018-09-19 – 2018-09-23 (×9): 100 mg via ORAL
  Filled 2018-09-19 (×9): qty 1

## 2018-09-19 MED ORDER — VALACYCLOVIR HCL 500 MG PO TABS
1000.0000 mg | ORAL_TABLET | Freq: Two times a day (BID) | ORAL | Status: DC
  Administered 2018-09-19 – 2018-09-23 (×8): 1000 mg via ORAL
  Filled 2018-09-19 (×8): qty 2

## 2018-09-19 MED ORDER — LACTATED RINGERS IV SOLN
INTRAVENOUS | Status: DC
  Administered 2018-09-19 – 2018-09-21 (×4): via INTRAVENOUS

## 2018-09-19 MED ORDER — VALACYCLOVIR HCL 500 MG PO TABS
500.0000 mg | ORAL_TABLET | Freq: Once | ORAL | Status: AC
  Administered 2018-09-19: 500 mg via ORAL
  Filled 2018-09-19: qty 1

## 2018-09-19 MED ORDER — VALACYCLOVIR HCL 500 MG PO TABS
500.0000 mg | ORAL_TABLET | Freq: Three times a day (TID) | ORAL | Status: DC
  Administered 2018-09-19: 500 mg via ORAL
  Filled 2018-09-19: qty 1

## 2018-09-19 MED ORDER — POTASSIUM CHLORIDE CRYS ER 20 MEQ PO TBCR
40.0000 meq | EXTENDED_RELEASE_TABLET | Freq: Once | ORAL | Status: AC
  Administered 2018-09-19: 40 meq via ORAL
  Filled 2018-09-19: qty 2

## 2018-09-19 MED ORDER — POLYETHYLENE GLYCOL 3350 17 G PO PACK
17.0000 g | PACK | Freq: Every day | ORAL | Status: DC
  Administered 2018-09-19 – 2018-09-23 (×5): 17 g via ORAL
  Filled 2018-09-19 (×5): qty 1

## 2018-09-19 NOTE — Procedures (Signed)
Indication: aseptic meningitis  Risks of the procedure were dicussed with the patient including post-LP headache, bleeding, infection, weakness/numbness of legs(radiculopathy), death.  The patient agreed and written consent was obtained.   The patient was prepped and draped, and using sterile technique a 20 gauge quinke spinal needle was inserted in the 3/4 and 2/3 space. 4 attempts and not able to get through--hitting bone.  Etta Quill PA-C [facility] [phone]  M-F  (9:00 am- 5:00 PM)  09/19/2018, 5:51 PM

## 2018-09-19 NOTE — Consult Note (Signed)
NEURO HOSPITALIST CONSULT NOTE   Requestig physician: Dr. Posey Pronto  Reason for Consult: Possible aseptic meningitis  History obtained from:   Patient and Chart     HPI:                                                                                                                                          Donna Mueller is an 28 y.o. female who presented to [facility] with severe herpes genitalis infection. She also complained of a severe, 10/10 headache with photophobia, which has since decreased to a 10/10 with pain medications. She also complained of mild neck stiffness. Hospitalist team is considering aseptic meningitis as a possible explanation for her headache. Neurology was called to evaluate.   Past Medical History:  Diagnosis Date  . ADD (attention deficit disorder)   . Difficulty sleeping   . GERD (gastroesophageal reflux disease)   . Headache    HX MIGRAINES  . Knee pain    LEFT  . Papillary thyroid carcinoma [facility])     Past Surgical History:  Procedure Laterality Date  . LYMPH NODE DISSECTION N/A 07/20/2015   Procedure: LYMPH NODE DISSECTION;  Surgeon: Armandina Gemma, MD;  Location: [facility];  Service: General;  Laterality: N/A;  . THYROIDECTOMY N/A 07/20/2015   Procedure: TOTAL THYROIDECTOMY WITH LIMITED CENTRAL COMPARTMENT  LYMPH NODE DISSECTION  AND AUTO-TRANSPLANTATION OF LEFT INFERIOR PARATHYROID ;  Surgeon: Armandina Gemma, MD;  Location: [facility];  Service: General;  Laterality: N/A;    Family History  Problem Relation Age of Onset  . Glaucoma Mother   . Anxiety disorder Father   . Healthy Brother   . Thyroid disease Paternal Uncle   . Lung cancer Maternal Grandmother   . Diabetes Maternal Grandfather               Social History:  reports that she has never smoked. She has never used smokeless tobacco. She reports current alcohol use. She reports that she does not use drugs.  No Known Allergies  MEDICATIONS:                                                                                                                      Prior to Admission:  Medications Prior to Admission  Medication  Sig Dispense Refill Last Dose  . acetaminophen (TYLENOL) 500 MG tablet Take 500 mg by mouth every 6 (six) hours as needed for mild pain.    unk  . buPROPion (WELLBUTRIN XL) 150 MG 24 hr tablet Take 150 mg by mouth daily.   09/15/2018  . cephALEXin (KEFLEX) 500 MG capsule Take 500 mg by mouth every 6 (six) hours. For 7 days   09/18/2018 at Unknown time  . JUNEL FE 24 1-20 MG-MCG(24) tablet Take 1 tablet by mouth daily.   8 09/18/2018 at Unknown time  . rizatriptan (MAXALT) 5 MG tablet Take 5 mg by mouth as needed for migraine.    09/16/2018  . SYNTHROID 137 MCG tablet Take 137 mcg by mouth daily.  0 09/18/2018 at Unknown time  . valACYclovir (VALTREX) 1000 MG tablet Take 1 g by mouth every 12 (twelve) hours.   09/18/2018 at Unknown time  . benzonatate (TESSALON) 100 MG capsule Take 1 capsule (100 mg total) by mouth 2 (two) times daily as needed for cough. (Patient not taking: Reported on 09/19/2018) 20 capsule 0 Not Taking at Unknown time  . naproxen (NAPROSYN) 375 MG tablet Take 1 tablet (375 mg total) by mouth 2 (two) times daily. (Patient not taking: Reported on 09/19/2018) 20 tablet 0 Not Taking at Unknown time   Scheduled: . docusate sodium  100 mg Oral BID  . levothyroxine  137 mcg Oral Daily  . lidocaine  1 application Urethral Once  . multivitamin with minerals  1 tablet Oral Daily  . naproxen  375 mg Oral BID  . Norethindrone Acetate-Ethinyl Estrad-FE  1 tablet Oral QHS  . polyethylene glycol  17 g Oral Daily  . valACYclovir  1,000 mg Oral BID   Continuous: . cefTRIAXone (ROCEPHIN)  IV    . doxycycline (VIBRAMYCIN) IV 100 mg (09/19/18 1403)  . lactated ringers       ROS:                                                                                                                                       No vision loss, confusion, difficulty with speech,  dysphagia. Has symptoms of labial herpes. Other ROS as per HPI.    Blood pressure 95/69, pulse 98, temperature 98.1 F (36.7 C), temperature source Oral, resp. rate 18, last menstrual period 08/23/2018, SpO2 100 %.   General Examination:  Physical Exam  HEENT-  [place]/AT   Lungs- Respirations unlabored Extremities- No edema  Neurological Examination Mental Status: Alert, fully oriented, thought content appropriate.  Speech fluent without evidence of aphasia.  Able to follow all commands without difficulty. Cranial Nerves: II: Visual fields intact bilaterally. PERRL.   III,IV, VI: No ptosis. EOMI without nystagmus.  V,VII: smile symmetric, facial temp sensation equal bilaterally VIII: hearing intact to voice IX,X: Palate rises symmetrically XI: Symmetric shoulder shrug XII: Midline tongue extension Motor: Right : Upper extremity   5/5    Left:     Upper extremity   5/5  Lower extremity   5/5     Lower extremity   5/5 Normal tone throughout; no atrophy noted No pronator drift Sensory: Temp and light touch intact x 4 without extinction Deep Tendon Reflexes: 2+ and symmetric throughout Plantars: Right: downgoing   Left: downgoing Cerebellar: No appendicular ataxia noted.  Gait: Deferred  Lab Results: Basic Metabolic Panel: Recent Labs  Lab 09/18/18 1620 09/19/18 0436  NA 136 139  K 3.2* 3.0*  CL 105 110  CO2 18* 21*  GLUCOSE 86 82  BUN 9 9  CREATININE 0.72 0.56  CALCIUM 8.7* 7.7*    CBC: Recent Labs  Lab 09/18/18 1620 09/19/18 0436  WBC 13.5* 11.2*  HGB 12.3 10.0*  HCT 38.5 30.5*  MCV 93.7 94.7  PLT 199 180    Cardiac Enzymes: No results for input(s): CKTOTAL, CKMB, CKMBINDEX, TROPONINI in the last 168 hours.  Lipid Panel: No results for input(s): CHOL, TRIG, HDL, CHOLHDL, VLDL, LDLCALC in the last 168 hours.  Imaging: Ct Pelvis W Contrast  Result Date:  09/18/2018 CLINICAL DATA:  Perineal cellulitis EXAM: CT PELVIS WITH CONTRAST TECHNIQUE: Multidetector CT imaging of the pelvis was performed using the standard protocol following the bolus administration of intravenous contrast. CONTRAST:  178mL OMNIPAQUE IOHEXOL 300 MG/ML  SOLN COMPARISON:  None. FINDINGS: Urinary Tract:  No abnormality visualized. Bowel:  Unremarkable visualized pelvic bowel loops. Vascular/Lymphatic: No pathologically enlarged lymph nodes. No significant vascular abnormality seen. Reproductive: There is mild subcutaneous induration of the anterior labia majora and inferior mons pubis without underlying collection or soft tissue gas. There is fluid within the vaginal vault. The uterus and ovaries are normal. Other:  None. Musculoskeletal: Normal IMPRESSION: Mild subcutaneous induration of the anterior labia majora and inferior mons pubis without abscess or soft tissue gas. Electronically Signed   By: Ulyses Jarred M.D.   On: 09/18/2018 18:55    Assessment: 28 year old female with severe herpes labialis. Now with severe headache.  1. DDx for headache includes aseptic meningitis. Unlikely to be an infectious meningitis as there is no altered mentation and no significant neck stiffness on exam.  2. Per an article from [employer]: "Aseptic meningitis is associated with herpes simplex virus (HSV) type 2 genital infection. The onset of herpes genitalis precedes the symptoms of meningitis by five to ten days. Herpes simplex virus is readily cultured from the skin, but it is difficult to recover from the cerebrospinal fluid." 3. CT head is normal. Not anticoagulated.   Recommendations: 1. Lumbar puncture. Obtain cell count with differential, protein, glucose, gram stain, fungal stain, bacterial and fungal culture, HSV 1 and 2 PCR, cyrptococcal antigen and IgG index.  2. Additional recommendations following LP results.     Electronically signed: Dr. Kerney Elbe 09/19/2018, 4:20  PM

## 2018-09-19 NOTE — Progress Notes (Signed)
Patient's IV is in the left [facility] and is positional, IV pump has continued to beep so that IV ABx and IV fluids are being delayed. Patient has refused to have IV moved 3 times, she admits she does not like needles.

## 2018-09-19 NOTE — Progress Notes (Signed)
[facility] Progress Note  Patient: Donna Mueller HFW:[id number]   PCP: Lutricia Feil, PA DOB: May 10, 1991   DOA: 09/18/2018   DOS: 09/19/2018   Date of Service: the patient was seen and examined on 09/19/2018  Brief hospital course: Pt. with no PMH; admitted on 09/18/2018, presented with complaint of swelling and pain in the vaginal area, was found to have herpes genitalis. Currently further plan is continue current care.  Subjective: Reports severe pain in her vaginal area but reports even more pain with frontal headache, photophobia, phonophobia, neck stiffness.  Also occasional nausea.  No fever no chills.  No confusion reported.  Assessment and Plan: 1.  Herpes genitalis. Extensive inflammation on examination. Continue sitz bath's, continue Valtrex dose increased from 500 3 times daily to 1000 twice daily. Concern for meningitis, will perform LP. Concern for coinfection continue with IV ceftriaxone and doxycycline. Work-up is currently pending. Patient does have urinary retention, concern for sacral plexus involvement although no radicular symptoms for now.  No bowel symptoms for now as well.  Will monitor.  Low threshold to transition to IV acyclovir.  Will consult ID formally tomorrow. Discussed with OB/GYN no indication for any further adjustment in treatment for now.  2.  Hypothyroidism. Postsurgical. Continue levothyroxine.  3.  ADD. Continue home regimen.  4.  Hypokalemia. Replaced.   Diet: regular diet DVT Prophylaxis: subcutaneous Heparin  Advance goals of care discussion: full code  Family Communication: no family was present at bedside, at the time of interview.   Disposition:  Discharge to home.  Consultants: neurology Procedures: LP  Scheduled Meds: . docusate sodium  100 mg Oral BID  . levothyroxine  137 mcg Oral Daily  . multivitamin with minerals  1 tablet Oral Daily  . naproxen  375 mg Oral BID  . Norethindrone Acetate-Ethinyl  Estrad-FE  1 tablet Oral QHS  . polyethylene glycol  17 g Oral Daily  . valACYclovir  1,000 mg Oral BID   Continuous Infusions: . cefTRIAXone (ROCEPHIN)  IV 2 g (09/19/18 1754)  . doxycycline (VIBRAMYCIN) IV 100 mg (09/19/18 1403)  . lactated ringers 75 mL/hr at 09/19/18 1813   PRN Meds: acetaminophen, benzonatate, butalbital-acetaminophen-caffeine, HYDROmorphone (DILAUDID) injection, lidocaine, ondansetron (ZOFRAN) IV, oxyCODONE Antibiotics: Anti-infectives (From admission, onward)   Start     Dose/Rate Route Frequency Ordered Stop   09/19/18 2200  valACYclovir (VALTREX) tablet 1,000 mg     1,000 mg Oral 2 times daily 09/19/18 1125     09/19/18 1230  doxycycline (VIBRAMYCIN) 100 mg in sodium chloride 0.9 % 250 mL IVPB     100 mg 125 mL/hr over 120 Minutes Intravenous Every 12 hours 09/19/18 1221     09/19/18 1230  cefTRIAXone (ROCEPHIN) 2 g in sodium chloride 0.9 % 100 mL IVPB     2 g 200 mL/hr over 30 Minutes Intravenous Every 24 hours 09/19/18 1221     09/19/18 1200  valACYclovir (VALTREX) tablet 500 mg     500 mg Oral  Once 09/19/18 1125 09/19/18 1249   09/19/18 0630  valACYclovir (VALTREX) tablet 500 mg  Status:  Discontinued     500 mg Oral 3 times daily 09/19/18 0615 09/19/18 1125   09/19/18 0600  vancomycin (VANCOCIN) IVPB 1000 mg/200 mL premix  Status:  Discontinued     1,000 mg 200 mL/hr over 60 Minutes Intravenous Every 12 hours 09/18/18 1958 09/19/18 1220   09/18/18 1958  ceFEPIme (MAXIPIME) 2 g in sodium chloride 0.9 % 100 mL IVPB  Status:  Discontinued     2 g 200 mL/hr over 30 Minutes Intravenous Every 12 hours 09/18/18 1958 09/19/18 1220   09/18/18 1600  vancomycin (VANCOCIN) IVPB 1000 mg/200 mL premix     1,000 mg 200 mL/hr over 60 Minutes Intravenous  Once 09/18/18 1548 09/18/18 1755   09/18/18 1600  cefTRIAXone (ROCEPHIN) 2 g in sodium chloride 0.9 % 100 mL IVPB     2 g 200 mL/hr over 30 Minutes Intravenous  Once 09/18/18 1548 09/18/18 1650       Objective:  Physical Exam: Vitals:   09/18/18 1959 09/18/18 2036 09/19/18 0504 09/19/18 0915  BP: 101/60 108/62 99/62 95/69   Pulse: (!) 113 (!) 112 100 98  Resp: 16 18 18 18   Temp: 99.7 F (37.6 C) 99.1 F (37.3 C) 98.5 F (36.9 C) 98.1 F (36.7 C)  TempSrc: Oral Oral Oral Oral  SpO2: 98% 99% 100% 100%    Intake/Output Summary (Last 24 hours) at 09/19/2018 2030 Last data filed at 09/19/2018 1813 Gross per 24 hour  Intake 2681.94 ml  Output 1450 ml  Net 1231.94 ml   There were no vitals filed for this visit. General: Alert, Awake and Oriented to Time, Place and Person. Appear in marked distress, affect appropriate Eyes: PERRL, Conjunctiva normal ENT: Oral Mucosa some ulcers moist. Neck: no JVD, no Abnormal Mass Or lumps Cardiovascular: S1 and S2 Present, no Murmur, Peripheral Pulses Present Respiratory: normal respiratory effort, Bilateral Air entry equal and Decreased, no use of accessory muscle, Clear to Auscultation, no Crackles, no wheezes Abdomen: Bowel Sound present, Soft and no tenderness, no hernia Extensive labial swelling, large ulceration with some induration and possible discharge.  Reportedly patient also has vulvar ulceration Skin: no redness, no Rash, no induration Extremities: no Pedal edema, no calf tenderness Neurologic: Grossly no focal neuro deficit. Bilaterally Equal motor strength  Data Reviewed: CBC: Recent Labs  Lab 09/18/18 1620 09/19/18 0436  WBC 13.5* 11.2*  HGB 12.3 10.0*  HCT 38.5 30.5*  MCV 93.7 94.7  PLT 199 956   Basic Metabolic Panel: Recent Labs  Lab 09/18/18 1620 09/19/18 0436  NA 136 139  K 3.2* 3.0*  CL 105 110  CO2 18* 21*  GLUCOSE 86 82  BUN 9 9  CREATININE 0.72 0.56  CALCIUM 8.7* 7.7*    Liver Function Tests: Recent Labs  Lab 09/19/18 0436  AST 7*  ALT 7  ALKPHOS 49  BILITOT 0.5  PROT 5.1*  ALBUMIN 2.4*   No results for input(s): LIPASE, AMYLASE in the last 168 hours. No results for input(s): AMMONIA in the last 168  hours. Coagulation Profile: No results for input(s): INR, PROTIME in the last 168 hours. Cardiac Enzymes: No results for input(s): CKTOTAL, CKMB, CKMBINDEX, TROPONINI in the last 168 hours. BNP (last 3 results) No results for input(s): PROBNP in the last 8760 hours. CBG: No results for input(s): GLUCAP in the last 168 hours. Studies: Ct Head Wo Contrast  Result Date: 09/19/2018 CLINICAL DATA:  Reported intracranial hemorrhage.  Follow-up exam. EXAM: CT HEAD WITHOUT CONTRAST TECHNIQUE: Contiguous axial images were obtained from the base of the skull through the vertex without intravenous contrast. COMPARISON:  None. FINDINGS: Brain: No evidence of acute infarction, hemorrhage, hydrocephalus, extra-axial collection or mass lesion/mass effect. Vascular: No hyperdense vessel or unexpected calcification. Skull: Normal. Negative for fracture or focal lesion. Sinuses/Orbits: Globes and orbits are unremarkable. Visualized sinuses and mastoid air cells are clear. Other: None. IMPRESSION: 1. Normal unenhanced CT scan of  the brain. Specifically, no intracranial hemorrhage. Electronically Signed   By: Lajean Manes M.D.   On: 09/19/2018 17:46     Time spent: 35 minutes  Author: Berle Mull, MD [facility] 09/19/2018 8:30 PM  To reach On-call, see care teams to locate the attending and reach out to them via [email or web address]. If 7PM-7AM, please contact night-coverage If you still have difficulty reaching the attending provider, please page the [facility] (Director on Call) for [facility] on amion for assistance.

## 2018-09-19 NOTE — Progress Notes (Signed)
Post void residual >999 mLs per bladder scan. Due to swelling, inflammation and pain MD ordered foley vs I&O caths. Patient was able to tolerate foley insertion with the use of lidocaine. Got back 1000 mLs of dark yellow urine.

## 2018-09-20 LAB — HEPATITIS C ANTIBODY (REFLEX): HCV Ab: <0.1 s/co ratio (ref 0.0–0.9)

## 2018-09-20 LAB — COMPREHENSIVE METABOLIC PANEL
ALT: 8 U/L (ref 0–44)
AST: 6 U/L — ABNORMAL LOW (ref 15–41)
Albumin: 2.1 g/dL — ABNORMAL LOW (ref 3.5–5.0)
Alkaline Phosphatase: 39 U/L (ref 38–126)
Anion gap: 8 (ref 5–15)
BUN: <5 mg/dL — ABNORMAL LOW (ref 6–20)
CO2: 21 mmol/L — ABNORMAL LOW (ref 22–32)
Calcium: 7.7 mg/dL — ABNORMAL LOW (ref 8.9–10.3)
Chloride: 109 mmol/L (ref 98–111)
Creatinine, Ser: 0.52 mg/dL (ref 0.44–1.00)
GFR calc Af Amer: >60 mL/min (ref >60)
Glucose, Bld: 85 mg/dL (ref 70–99)
Potassium: 3.2 mmol/L — ABNORMAL LOW (ref 3.5–5.1)
Sodium: 138 mmol/L (ref 135–145)
Total Bilirubin: 0.6 mg/dL (ref 0.3–1.2)
Total Protein: 4.8 g/dL — ABNORMAL LOW (ref 6.5–8.1)

## 2018-09-20 LAB — CBC WITH DIFFERENTIAL/PLATELET
Abs Immature Granulocytes: 0.09 10*3/uL — ABNORMAL HIGH (ref 0.00–0.07)
Basophils Absolute: 0 10*3/uL (ref 0.0–0.1)
Basophils Relative: 0 %
EOS ABS: 0.1 10*3/uL (ref 0.0–0.5)
Eosinophils Relative: 2 %
HCT: 29.7 % — ABNORMAL LOW (ref 36.0–46.0)
Hemoglobin: 9.6 g/dL — ABNORMAL LOW (ref 12.0–15.0)
Immature Granulocytes: 2 %
Lymphocytes Relative: 16 %
Lymphs Abs: 1 10*3/uL (ref 0.7–4.0)
MCH: 30.3 pg (ref 26.0–34.0)
MCHC: 32.3 g/dL (ref 30.0–36.0)
MCV: 93.7 fL (ref 80.0–100.0)
Monocytes Absolute: 0.7 10*3/uL (ref 0.1–1.0)
Monocytes Relative: 11 %
NEUTROS PCT: 69 %
Neutro Abs: 4.3 10*3/uL (ref 1.7–7.7)
Platelets: 169 10*3/uL (ref 150–400)
RBC: 3.17 MIL/uL — ABNORMAL LOW (ref 3.87–5.11)
RDW: 12.4 % (ref 11.5–15.5)
WBC: 6.2 10*3/uL (ref 4.0–10.5)
nRBC: 0 % (ref 0.0–0.2)

## 2018-09-20 LAB — GC/CHLAMYDIA PROBE AMP (~~LOC~~) NOT AT ARMC
Chlamydia: NEGATIVE
Neisseria Gonorrhea: NEGATIVE

## 2018-09-20 LAB — HCV COMMENT:

## 2018-09-20 LAB — HEPATITIS B SURFACE ANTIGEN: Hepatitis B Surface Ag: NEGATIVE

## 2018-09-20 LAB — MAGNESIUM: MAGNESIUM: 1.6 mg/dL — AB (ref 1.7–2.4)

## 2018-09-20 LAB — HIV ANTIBODY (ROUTINE TESTING W REFLEX): HIV Screen 4th Generation wRfx: NONREACTIVE

## 2018-09-20 MED ORDER — LIDOCAINE HCL 4 % EX SOLN
Freq: Four times a day (QID) | CUTANEOUS | Status: DC
  Administered 2018-09-20: 1 mL via TOPICAL
  Administered 2018-09-21: 18:00:00 via TOPICAL
  Administered 2018-09-21: 1 mL via TOPICAL
  Administered 2018-09-21 (×2): via TOPICAL
  Administered 2018-09-21: 1 mL via TOPICAL
  Administered 2018-09-22 (×2): via TOPICAL
  Administered 2018-09-22: 1 mL via TOPICAL
  Administered 2018-09-22 – 2018-09-23 (×3): via TOPICAL
  Filled 2018-09-20: qty 50

## 2018-09-20 MED ORDER — MAGNESIUM SULFATE 2 GM/50ML IV SOLN
2.0000 g | Freq: Once | INTRAVENOUS | Status: AC
  Administered 2018-09-20: 2 g via INTRAVENOUS
  Filled 2018-09-20: qty 50

## 2018-09-20 MED ORDER — POTASSIUM CHLORIDE CRYS ER 20 MEQ PO TBCR
40.0000 meq | EXTENDED_RELEASE_TABLET | Freq: Once | ORAL | Status: AC
  Administered 2018-09-20: 40 meq via ORAL
  Filled 2018-09-20: qty 2

## 2018-09-20 MED ORDER — BENZOCAINE 10 % MT GEL
Freq: Four times a day (QID) | OROMUCOSAL | Status: DC | PRN
  Administered 2018-09-20 – 2018-09-21 (×4): via OROMUCOSAL
  Filled 2018-09-20: qty 9

## 2018-09-20 NOTE — Progress Notes (Signed)
[facility] Progress Note  Patient: Donna Mueller IWL:[id number]   PCP: Lutricia Feil, PA DOB: [birth year]   DOA: 09/18/2018   DOS: 09/20/2018   Date of Service: the patient was seen and examined on 09/20/2018  Brief hospital course: Pt. with no PMH; admitted on 09/18/2018, presented with complaint of swelling and pain in the vaginal area, was found to have herpes genitalis. Currently further plan is continue current care.  Subjective: Pain is better, headache better.  No nausea no vomiting no fever no chills.  No chest pain abdominal pain.  Assessment and Plan: 1.  Herpes genitalis. Extensive inflammation on examination. Continue sitz bath's, continue Valtrex dose increased from 500 3 times daily to 1000 twice daily. Concern for meningitis, will perform LP. Concern for coinfection continue with IV ceftriaxone and doxycycline. Work-up is currently pending. Patient does have urinary retention, concern for sacral plexus involvement although no radicular symptoms for now.  No bowel symptoms for now as well.  Will monitor.  Low threshold to transition to IV acyclovir.  Will consult ID formally. Discussed with OB/GYN no indication for any further adjustment in treatment for now.  2.  Hypothyroidism. Postsurgical. Continue levothyroxine.  3.  ADD. Continue home regimen.  4.  Hypokalemia. Replaced.   Diet: regular diet DVT Prophylaxis: subcutaneous Heparin  Advance goals of care discussion: full code  Family Communication: no family was present at bedside, at the time of interview.   Disposition:  Discharge to home.  Consultants: neurology Procedures: LP  Scheduled Meds: . docusate sodium  100 mg Oral BID  . levothyroxine  137 mcg Oral Daily  . multivitamin with minerals  1 tablet Oral Daily  . naproxen  375 mg Oral BID  . Norethindrone Acetate-Ethinyl Estrad-FE  1 tablet Oral QHS  . polyethylene glycol  17 g Oral Daily  . valACYclovir  1,000 mg Oral BID    Continuous Infusions: . cefTRIAXone (ROCEPHIN)  IV 2 g (09/19/18 1754)  . doxycycline (VIBRAMYCIN) IV 100 mg (09/20/18 1151)  . lactated ringers 75 mL/hr at 09/20/18 1053   PRN Meds: acetaminophen, benzocaine, benzonatate, butalbital-acetaminophen-caffeine, HYDROmorphone (DILAUDID) injection, lidocaine, ondansetron (ZOFRAN) IV, oxyCODONE Antibiotics: Anti-infectives (From admission, onward)   Start     Dose/Rate Route Frequency Ordered Stop   09/19/18 2200  valACYclovir (VALTREX) tablet 1,000 mg     1,000 mg Oral 2 times daily 09/19/18 1125     09/19/18 1230  doxycycline (VIBRAMYCIN) 100 mg in sodium chloride 0.9 % 250 mL IVPB     100 mg 125 mL/hr over 120 Minutes Intravenous Every 12 hours 09/19/18 1221     09/19/18 1230  cefTRIAXone (ROCEPHIN) 2 g in sodium chloride 0.9 % 100 mL IVPB     2 g 200 mL/hr over 30 Minutes Intravenous Every 24 hours 09/19/18 1221     09/19/18 1200  valACYclovir (VALTREX) tablet 500 mg     500 mg Oral  Once 09/19/18 1125 09/19/18 1249   09/19/18 0630  valACYclovir (VALTREX) tablet 500 mg  Status:  Discontinued     500 mg Oral 3 times daily 09/19/18 0615 09/19/18 1125   09/19/18 0600  vancomycin (VANCOCIN) IVPB 1000 mg/200 mL premix  Status:  Discontinued     1,000 mg 200 mL/hr over 60 Minutes Intravenous Every 12 hours 09/18/18 1958 09/19/18 1220   09/18/18 1958  ceFEPIme (MAXIPIME) 2 g in sodium chloride 0.9 % 100 mL IVPB  Status:  Discontinued     2 g 200 mL/hr  over 30 Minutes Intravenous Every 12 hours 09/18/18 1958 09/19/18 1220   09/18/18 1600  vancomycin (VANCOCIN) IVPB 1000 mg/200 mL premix     1,000 mg 200 mL/hr over 60 Minutes Intravenous  Once 09/18/18 1548 09/18/18 1755   09/18/18 1600  cefTRIAXone (ROCEPHIN) 2 g in sodium chloride 0.9 % 100 mL IVPB     2 g 200 mL/hr over 30 Minutes Intravenous  Once 09/18/18 1548 09/18/18 1650       Objective: Physical Exam: Vitals:   09/19/18 0915 09/19/18 2156 09/20/18 0450 09/20/18 1030  BP: 95/69  104/69 99/61 120/73  Pulse: 98 (!) 106 (!) 102 99  Resp: 18 18 19 18   Temp: 98.1 F (36.7 C) 98.8 F (37.1 C) 98.4 F (36.9 C) 98.4 F (36.9 C)  TempSrc: Oral Oral Oral Oral  SpO2: 100% 98% 98% 96%  Weight:  56.3 kg      Intake/Output Summary (Last 24 hours) at 09/20/2018 1352 Last data filed at 09/20/2018 0900 Gross per 24 hour  Intake 1437.2 ml  Output 1750 ml  Net -312.8 ml   Filed Weights   09/19/18 2156  Weight: 56.3 kg   General: Alert, Awake and Oriented to Time, Place and Person. Appear in marked distress, affect appropriate Eyes: PERRL, Conjunctiva normal ENT: Oral Mucosa some ulcers moist. Neck: no JVD, no Abnormal Mass Or lumps Cardiovascular: S1 and S2 Present, no Murmur, Peripheral Pulses Present Respiratory: normal respiratory effort, Bilateral Air entry equal and Decreased, no use of accessory muscle, Clear to Auscultation, no Crackles, no wheezes Abdomen: Bowel Sound present, Soft and no tenderness, no hernia Extensive labial swelling, large ulceration with some induration and possible discharge.  Reportedly patient also has vulvar ulceration Skin: no redness, no Rash, no induration Extremities: no Pedal edema, no calf tenderness Neurologic: Grossly no focal neuro deficit. Bilaterally Equal motor strength  Data Reviewed: CBC: Recent Labs  Lab 09/18/18 1620 09/19/18 0436 09/20/18 0417  WBC 13.5* 11.2* 6.2  NEUTROABS  --   --  4.3  HGB 12.3 10.0* 9.6*  HCT 38.5 30.5* 29.7*  MCV 93.7 94.7 93.7  PLT 199 180 856   Basic Metabolic Panel: Recent Labs  Lab 09/18/18 1620 09/19/18 0436 09/20/18 0417  NA 136 139 138  K 3.2* 3.0* 3.2*  CL 105 110 109  CO2 18* 21* 21*  GLUCOSE 86 82 85  BUN 9 9 <5*  CREATININE 0.72 0.56 0.52  CALCIUM 8.7* 7.7* 7.7*  MG  --   --  1.6*    Liver Function Tests: Recent Labs  Lab 09/19/18 0436 09/20/18 0417  AST 7* 6*  ALT 7 8  ALKPHOS 49 39  BILITOT 0.5 0.6  PROT 5.1* 4.8*  ALBUMIN 2.4* 2.1*   No results for  input(s): LIPASE, AMYLASE in the last 168 hours. No results for input(s): AMMONIA in the last 168 hours. Coagulation Profile: No results for input(s): INR, PROTIME in the last 168 hours. Cardiac Enzymes: No results for input(s): CKTOTAL, CKMB, CKMBINDEX, TROPONINI in the last 168 hours. BNP (last 3 results) No results for input(s): PROBNP in the last 8760 hours. CBG: No results for input(s): GLUCAP in the last 168 hours. Studies: Ct Head Wo Contrast  Result Date: 09/19/2018 CLINICAL DATA:  Reported intracranial hemorrhage.  Follow-up exam. EXAM: CT HEAD WITHOUT CONTRAST TECHNIQUE: Contiguous axial images were obtained from the base of the skull through the vertex without intravenous contrast. COMPARISON:  None. FINDINGS: Brain: No evidence of acute infarction, hemorrhage, hydrocephalus, extra-axial  collection or mass lesion/mass effect. Vascular: No hyperdense vessel or unexpected calcification. Skull: Normal. Negative for fracture or focal lesion. Sinuses/Orbits: Globes and orbits are unremarkable. Visualized sinuses and mastoid air cells are clear. Other: None. IMPRESSION: 1. Normal unenhanced CT scan of the brain. Specifically, no intracranial hemorrhage. Electronically Signed   By: Lajean Manes M.D.   On: 09/19/2018 17:46     Time spent: 35 minutes  Author: Berle Mull, MD [facility] 09/20/2018 1:52 PM  To reach On-call, see care teams to locate the attending and reach out to them via [email or web address]. If 7PM-7AM, please contact night-coverage If you still have difficulty reaching the attending provider, please page the [facility] (Director on Call) for [facility] on amion for assistance.

## 2018-09-21 NOTE — Progress Notes (Signed)
PROGRESS NOTE    Patient: Donna Mueller                            PCP: Lutricia Feil, [place]                    DOB: [birth year]            DOA: 09/18/2018 AOZ:[id number]             DOS: 09/21/2018, 11:06 AM   LOS: 3 days   Date of Service: The patient was seen and examined on 09/21/2018  Subjective:   Patient was seen and examined this morning.  Awake alert.  Reporting Foley catheter has helped with tenderness and pain.  Reports headaches has improved.  Reports tenderness swelling and redness in vaginal area has not improved. She remains to be very uncomfortable.  Denies of having any fever or chills as of last night.  -LP procedure yesterday was unsuccessful-   Brief Narrative:   Pt. with no PMH; admitted on 09/18/2018, presented with complaint of swelling and pain in the vaginal area, was found to have herpes genitalis. Currently further plan is continue current care.  Principal Problem:   Sepsis, Gram negative ([place]) Active Problems:   Papillary thyroid carcinoma (HCC)   Post-surgical hypothyroidism   Vulvar cellulitis   Herpes labialis    Assessment & Plan:   Herpes genitalis. -Remains to has extensive inflammation and edema and open ulcers in the inside of labia majora -PRN analgesics -Continue sitz bath's, continue Valtrex dose increased from 500 3 times daily to 1000 twice daily.  Concern for coinfection continue with IV ceftriaxone and doxycycline. Work-up is currently pending. Patient does have urinary retention, concern for sacral plexus involvement although no radicular symptoms for now.  No bowel symptoms for now as well.  Will monitor.  Low threshold to transition to IV acyclovir.  Consulted  ID formally-noted and charted Discussed with OB/GYN no indication for any further adjustment in treatment for now.  Headaches which has improved otherwise no focal or meningeal signs -LP attempted on 09/20/2018 was unsuccessful, no CSF fluid was obtained for analysis  Neurology following  CT of the head - IMPRESSION: 1. Normal unenhanced CT scan of the brain. Specifically, no intracranial hemorrhage.   Hypothyroidism. Postsurgical. Continue levothyroxine.   ADD. Continue home regimen.  Hypokalemia. Replaced.   Diet: regular diet DVT Prophylaxis: subcutaneous Heparin  Advance goals of care discussion: full code  Family Communication: no family was present at bedside, at the time of interview.   Disposition:  Discharge to home.  Consultants: neurology Procedures: LP attempted 09/20/2018 unsuccessful  Procedures:     Successful LP  Antimicrobials:  Anti-infectives (From admission, onward)   Start     Dose/Rate Route Frequency Ordered Stop   09/19/18 2200  valACYclovir (VALTREX) tablet 1,000 mg     1,000 mg Oral 2 times daily 09/19/18 1125     09/19/18 1230  doxycycline (VIBRAMYCIN) 100 mg in sodium chloride 0.9 % 250 mL IVPB     100 mg 125 mL/hr over 120 Minutes Intravenous Every 12 hours 09/19/18 1221     09/19/18 1230  cefTRIAXone (ROCEPHIN) 2 g in sodium chloride 0.9 % 100 mL IVPB     2 g 200 mL/hr over 30 Minutes Intravenous Every 24 hours 09/19/18 1221     09/19/18 1200  valACYclovir (VALTREX) tablet 500 mg     500  mg Oral  Once 09/19/18 1125 09/19/18 1249   09/19/18 0630  valACYclovir (VALTREX) tablet 500 mg  Status:  Discontinued     500 mg Oral 3 times daily 09/19/18 0615 09/19/18 1125   09/19/18 0600  vancomycin (VANCOCIN) IVPB 1000 mg/200 mL premix  Status:  Discontinued     1,000 mg 200 mL/hr over 60 Minutes Intravenous Every 12 hours 09/18/18 1958 09/19/18 1220   09/18/18 1958  ceFEPIme (MAXIPIME) 2 g in sodium chloride 0.9 % 100 mL IVPB  Status:  Discontinued     2 g 200 mL/hr over 30 Minutes Intravenous Every 12 hours 09/18/18 1958 09/19/18 1220   09/18/18 1600  vancomycin (VANCOCIN) IVPB 1000 mg/200 mL premix     1,000 mg 200 mL/hr over 60 Minutes Intravenous  Once 09/18/18 1548 09/18/18 1755    09/18/18 1600  cefTRIAXone (ROCEPHIN) 2 g in sodium chloride 0.9 % 100 mL IVPB     2 g 200 mL/hr over 30 Minutes Intravenous  Once 09/18/18 1548 09/18/18 1650       Medication:  . docusate sodium  100 mg Oral BID  . levothyroxine  137 mcg Oral Daily  . lidocaine   Topical QID  . multivitamin with minerals  1 tablet Oral Daily  . naproxen  375 mg Oral BID  . Norethindrone Acetate-Ethinyl Estrad-FE  1 tablet Oral QHS  . polyethylene glycol  17 g Oral Daily  . valACYclovir  1,000 mg Oral BID    acetaminophen, benzocaine, benzonatate, butalbital-acetaminophen-caffeine, HYDROmorphone (DILAUDID) injection, ondansetron (ZOFRAN) IV, oxyCODONE     Objective:   Vitals:   09/20/18 1659 09/20/18 2015 09/21/18 0633 09/21/18 0938  BP: 112/80 110/75 105/73 111/70  Pulse: 84 79 73 79  Resp: 18 16 16    Temp: 98.2 F (36.8 C) 98.1 F (36.7 C) 98.4 F (36.9 C) 98.2 F (36.8 C)  TempSrc: Oral Oral Oral Oral  SpO2: 98% 98% 98% 99%  Weight:  56.1 kg    Height:        Intake/Output Summary (Last 24 hours) at 09/21/2018 1106 Last data filed at 09/21/2018 0940 Gross per 24 hour  Intake 1957.1 ml  Output 2450 ml  Net -492.9 ml   Filed Weights   09/19/18 2156 09/20/18 1500 09/20/18 2015  Weight: 56.3 kg 56.3 kg 56.1 kg     Examination:    General exam: Appears calm and comfortable  BP 111/70 (BP Location: Right Arm)   Pulse 79   Temp 98.2 F (36.8 C) (Oral)   Resp 16   Ht 5\' 2"  (1.575 m)   Wt 56.1 kg   LMP 08/23/2018 (Exact Date)   SpO2 99%   BMI 22.62 kg/m    Physical Exam  Constitution:  Alert, cooperative, no distress,  Psychiatric: Normal and stable mood and affect, cognition intact,   HEENT: Normocephalic, PERRL, otherwise with in Normal limits  Chest:Chest symmetric Cardio vascular:  S1/S2, RRR, No murmure, No Rubs or Gallops  pulmonary: Clear to auscultation bilaterally, respirations unlabored, negative wheezes / crackles Abdomen: Soft, non-tender,  non-distended, bowel sounds,no masses, no organomegaly Muscular skeletal: Limited exam - in bed, able to move all 4 extremities, Normal strength,  Neuro: CNII-XII intact. , normal motor and sensation, reflexes intact  Extremities: No pitting edema lower extremities, +2 pulses  Skin/pelvic: Perineal area clear, vaginal labia Dora edema, erythema, open ulcers in the labia open painful tender, Foley cath in place, Otherwise skin warm to touch, negative for any other Rashes, No open  wounds  Wounds: per nursing documentation  LABs:  CBC Latest Ref Rng & Units 09/20/2018 09/19/2018 09/18/2018  WBC 4.0 - 10.5 K/uL 6.2 11.2(H) 13.5(H)  Hemoglobin 12.0 - 15.0 g/dL 9.6(L) 10.0(L) 12.3  Hematocrit 36.0 - 46.0 % 29.7(L) 30.5(L) 38.5  Platelets 150 - 400 K/uL 169 180 199   CMP Latest Ref Rng & Units 09/20/2018 09/19/2018 09/18/2018  Glucose 70 - 99 mg/dL 85 82 86  BUN 6 - 20 mg/dL <5(L) 9 9  Creatinine 0.44 - 1.00 mg/dL 0.52 0.56 0.72  Sodium 135 - 145 mmol/L 138 139 136  Potassium 3.5 - 5.1 mmol/L 3.2(L) 3.0(L) 3.2(L)  Chloride 98 - 111 mmol/L 109 110 105  CO2 22 - 32 mmol/L 21(L) 21(L) 18(L)  Calcium 8.9 - 10.3 mg/dL 7.7(L) 7.7(L) 8.7(L)  Total Protein 6.5 - 8.1 g/dL 4.8(L) 5.1(L) -  Total Bilirubin 0.3 - 1.2 mg/dL 0.6 0.5 -  Alkaline Phos 38 - 126 U/L 39 49 -  AST 15 - 41 U/L 6(L) 7(L) -  ALT 0 - 44 U/L 8 7 -        @IMAGES @

## 2018-09-22 LAB — AEROBIC CULTURE W GRAM STAIN (SUPERFICIAL SPECIMEN): Culture: NORMAL

## 2018-09-22 LAB — HERPES SIMPLEX VIRUS(HSV) DNA BY PCR: HSV 1 DNA: NEGATIVE

## 2018-09-22 LAB — AEROBIC CULTURE  (SUPERFICIAL SPECIMEN)

## 2018-09-22 LAB — HSV DNA BY PCR (REFERENCE LAB): HSV 2 DNA: NEGATIVE

## 2018-09-22 NOTE — Progress Notes (Signed)
PROGRESS NOTE    Patient: Donna Mueller                            PCP: Lutricia Feil, [place]                    DOB: 1991/02/18            DOA: 09/18/2018 EPP:[id number]             DOS: 09/22/2018, 11:53 AM   LOS: 4 days   Date of Service: The patient was seen and examined on 09/22/2018.  Patient was seen alongside patient's nurse.  Subjective:   Patient seen. No new complaints. Herpetic rash/lesion seem to be improving, but still causing significant discomfort.  -LP procedure was unsuccessful.  No photophobia or light avoidance.  No neck stiffness.  Brief Narrative:   Pt. with no PMH; admitted on 09/18/2018, presented with complaint of swelling and pain in the vaginal area, was found to have herpes genitalis. Currently further plan is continue current care.  Principal Problem:   Sepsis, Gram negative ([place]) Active Problems:   Papillary thyroid carcinoma ([facility])   Post-surgical hypothyroidism   Vulvar cellulitis   Herpes labialis    Assessment & Plan:   Herpes genitalis. -Remains to has extensive inflammation and edema and open ulcers in the inside of labia majora -PRN analgesics -Continue sitz bath's, continue Valtrex dose increased from 500 3 times daily to 1000 twice daily.  Concern for coinfection continue with IV ceftriaxone and doxycycline. Work-up is currently pending. Patient does have urinary retention, concern for sacral plexus involvement although no radicular symptoms for now.  No bowel symptoms for now as well.  Will monitor.  Low threshold to transition to IV acyclovir.  Consulted  ID formally-noted and charted Discussed with OB/GYN no indication for any further adjustment in treatment for now.  Headaches which has improved otherwise no focal or meningeal signs -LP attempted on 09/20/2018 was unsuccessful, no CSF fluid was obtained for analysis Neurology following  CT of the head - IMPRESSION: 1. Normal unenhanced CT scan of the brain. Specifically, no  intracranial hemorrhage. 09/22/2018: Test for chlamydia and gonorrhea came back negative.  Will discontinue IV Rocephin and doxycycline.  Will send labial swab for herpes PCR as well as culture and sensitivity.  We will continue Valtrex for now.  Start discharge planning.   Hypothyroidism. Postsurgical. Continue levothyroxine.   ADD. Continue home regimen.  Hypokalemia. Replaced. 09/22/2018: Repeat renal panel in the morning.   Diet: regular diet DVT Prophylaxis: subcutaneous Heparin  Advance goals of care discussion: full code  Family Communication: no family was present at bedside, at the time of interview.   Disposition:  Discharge to home.  Consultants: neurology Procedures: LP attempted 09/20/2018 unsuccessful  Procedures:    Antimicrobials:  Anti-infectives (From admission, onward)   Start     Dose/Rate Route Frequency Ordered Stop   09/19/18 2200  valACYclovir (VALTREX) tablet 1,000 mg     1,000 mg Oral 2 times daily 09/19/18 1125     09/19/18 1230  doxycycline (VIBRAMYCIN) 100 mg in sodium chloride 0.9 % 250 mL IVPB     100 mg 125 mL/hr over 120 Minutes Intravenous Every 12 hours 09/19/18 1221     09/19/18 1230  cefTRIAXone (ROCEPHIN) 2 g in sodium chloride 0.9 % 100 mL IVPB     2 g 200 mL/hr over 30 Minutes Intravenous Every 24  hours 09/19/18 1221     09/19/18 1200  valACYclovir (VALTREX) tablet 500 mg     500 mg Oral  Once 09/19/18 1125 09/19/18 1249   09/19/18 0630  valACYclovir (VALTREX) tablet 500 mg  Status:  Discontinued     500 mg Oral 3 times daily 09/19/18 0615 09/19/18 1125   09/19/18 0600  vancomycin (VANCOCIN) IVPB 1000 mg/200 mL premix  Status:  Discontinued     1,000 mg 200 mL/hr over 60 Minutes Intravenous Every 12 hours 09/18/18 1958 09/19/18 1220   09/18/18 1958  ceFEPIme (MAXIPIME) 2 g in sodium chloride 0.9 % 100 mL IVPB  Status:  Discontinued     2 g 200 mL/hr over 30 Minutes Intravenous Every 12 hours 09/18/18 1958 09/19/18 1220    09/18/18 1600  vancomycin (VANCOCIN) IVPB 1000 mg/200 mL premix     1,000 mg 200 mL/hr over 60 Minutes Intravenous  Once 09/18/18 1548 09/18/18 1755   09/18/18 1600  cefTRIAXone (ROCEPHIN) 2 g in sodium chloride 0.9 % 100 mL IVPB     2 g 200 mL/hr over 30 Minutes Intravenous  Once 09/18/18 1548 09/18/18 1650       Medication:  . docusate sodium  100 mg Oral BID  . levothyroxine  137 mcg Oral Daily  . lidocaine   Topical QID  . multivitamin with minerals  1 tablet Oral Daily  . naproxen  375 mg Oral BID  . Norethindrone Acetate-Ethinyl Estrad-FE  1 tablet Oral QHS  . polyethylene glycol  17 g Oral Daily  . valACYclovir  1,000 mg Oral BID    acetaminophen, benzocaine, benzonatate, butalbital-acetaminophen-caffeine, HYDROmorphone (DILAUDID) injection, ondansetron (ZOFRAN) IV, oxyCODONE     Objective:   Vitals:   09/21/18 0938 09/21/18 2132 09/22/18 0521 09/22/18 0851  BP: 111/70 108/79 105/74 110/70  Pulse: 79 72 76 83  Resp:  18 18   Temp: 98.2 F (36.8 C) 98.6 F (37 C) 98.5 F (36.9 C) 98.1 F (36.7 C)  TempSrc: Oral Oral Oral Oral  SpO2: 99% 100% 99% 98%  Weight:  56.3 kg    Height:        Intake/Output Summary (Last 24 hours) at 09/22/2018 1153 Last data filed at 09/22/2018 0900 Gross per 24 hour  Intake 2329.1 ml  Output 2850 ml  Net -520.9 ml   Filed Weights   09/20/18 1500 09/20/18 2015 09/21/18 2132  Weight: 56.3 kg 56.1 kg 56.3 kg     Examination:    General exam: Appears calm and comfortable  BP 110/70 (BP Location: Left Arm)   Pulse 83   Temp 98.1 F (36.7 C) (Oral)   Resp 18   Ht 5\' 2"  (1.575 m)   Wt 56.3 kg   LMP 08/23/2018 (Exact Date)   SpO2 98%   BMI 22.70 kg/m    Physical Exam  Constitution:  Alert, cooperative, no distress,  Psychiatric: Normal and stable mood and affect, cognition intact,   HEENT: Normocephalic, PERRL, otherwise with in Normal limits  Chest:Chest symmetric Cardio vascular:  S1/S2, RRR, No murmure, No Rubs  or Gallops  pulmonary: Clear to auscultation bilaterally, respirations unlabored, negative wheezes / crackles Abdomen: Soft, non-tender, non-distended, bowel sounds,no masses, no organomegaly Muscular skeletal: Limited exam - in bed, able to move all 4 extremities, Normal strength,  Neuro: CNII-XII intact. , normal motor and sensation, reflexes intact  Extremities: No pitting edema lower extremities, +2 pulses  Skin/pelvic: Perineal area clear, vaginal labia Dora edema, erythema, open ulcers in the  labia open painful tender, Foley cath in place, Otherwise skin warm to touch, negative for any other Rashes, No open wounds  Wounds: per nursing documentation  LABs:  CBC Latest Ref Rng & Units 09/20/2018 09/19/2018 09/18/2018  WBC 4.0 - 10.5 K/uL 6.2 11.2(H) 13.5(H)  Hemoglobin 12.0 - 15.0 g/dL 9.6(L) 10.0(L) 12.3  Hematocrit 36.0 - 46.0 % 29.7(L) 30.5(L) 38.5  Platelets 150 - 400 K/uL 169 180 199   CMP Latest Ref Rng & Units 09/20/2018 09/19/2018 09/18/2018  Glucose 70 - 99 mg/dL 85 82 86  BUN 6 - 20 mg/dL <5(L) 9 9  Creatinine 0.44 - 1.00 mg/dL 0.52 0.56 0.72  Sodium 135 - 145 mmol/L 138 139 136  Potassium 3.5 - 5.1 mmol/L 3.2(L) 3.0(L) 3.2(L)  Chloride 98 - 111 mmol/L 109 110 105  CO2 22 - 32 mmol/L 21(L) 21(L) 18(L)  Calcium 8.9 - 10.3 mg/dL 7.7(L) 7.7(L) 8.7(L)  Total Protein 6.5 - 8.1 g/dL 4.8(L) 5.1(L) -  Total Bilirubin 0.3 - 1.2 mg/dL 0.6 0.5 -  Alkaline Phos 38 - 126 U/L 39 49 -  AST 15 - 41 U/L 6(L) 7(L) -  ALT 0 - 44 U/L 8 7 -        @IMAGES @

## 2018-09-22 NOTE — Progress Notes (Signed)
VIR LP still not obtained. If headaches are resolved and patient is afebrile, risks of proceeding may outweigh benefits. Initial bedside LP attempt was unsuccessful.  Neurology will sign off for now. Please call if LP is obtained.   Electronically signed: Dr. Kerney Elbe

## 2018-09-23 LAB — RENAL FUNCTION PANEL
Albumin: 2.4 g/dL — ABNORMAL LOW (ref 3.5–5.0)
Anion gap: 5 (ref 5–15)
BUN: <5 mg/dL — ABNORMAL LOW (ref 6–20)
CO2: 25 mmol/L (ref 22–32)
Calcium: 7.9 mg/dL — ABNORMAL LOW (ref 8.9–10.3)
Chloride: 107 mmol/L (ref 98–111)
Creatinine, Ser: 0.5 mg/dL (ref 0.44–1.00)
GFR calc Af Amer: >60 mL/min (ref >60)
GFR calc non Af Amer: >60 mL/min (ref >60)
Glucose, Bld: 94 mg/dL (ref 70–99)
Phosphorus: 3.9 mg/dL (ref 2.5–4.6)
Potassium: 3.2 mmol/L — ABNORMAL LOW (ref 3.5–5.1)
Sodium: 137 mmol/L (ref 135–145)

## 2018-09-23 LAB — CULTURE, BLOOD (ROUTINE X 2)
Culture: NO GROWTH
Culture: NO GROWTH
Special Requests: ADEQUATE
Special Requests: ADEQUATE

## 2018-09-23 LAB — MAGNESIUM: Magnesium: 1.8 mg/dL (ref 1.7–2.4)

## 2018-09-23 MED ORDER — POLYETHYLENE GLYCOL 3350 17 G PO PACK: 17.0000 g | PACK | Freq: Every day | ORAL | 0 refills | Status: DC

## 2018-09-23 MED ORDER — DOCUSATE SODIUM 100 MG PO CAPS: 100.0000 mg | ORAL_CAPSULE | Freq: Two times a day (BID) | ORAL | 0 refills | Status: DC

## 2018-09-23 MED ORDER — BENZOCAINE 10 % MT GEL: Freq: Four times a day (QID) | OROMUCOSAL | 0 refills | Status: DC | PRN

## 2018-09-23 MED ORDER — ADULT MULTIVITAMIN W/MINERALS CH: 1.0000 | ORAL_TABLET | Freq: Every day | ORAL | 0 refills | Status: DC

## 2018-09-23 MED ORDER — POTASSIUM CHLORIDE CRYS ER 20 MEQ PO TBCR
40.0000 meq | EXTENDED_RELEASE_TABLET | ORAL | Status: AC
  Administered 2018-09-23 (×2): 40 meq via ORAL
  Filled 2018-09-23 (×3): qty 2

## 2018-09-23 MED ORDER — LIDOCAINE HCL 4 % EX SOLN: Freq: Four times a day (QID) | CUTANEOUS | 0 refills | Status: DC

## 2018-09-23 MED ORDER — VALACYCLOVIR HCL 1 G PO TABS: 1000.0000 mg | ORAL_TABLET | Freq: Two times a day (BID) | ORAL | 0 refills | Status: AC

## 2018-09-23 MED ORDER — OXYCODONE HCL 5 MG PO TABS: 5.0000 mg | ORAL_TABLET | ORAL | 0 refills | Status: DC | PRN

## 2018-09-23 NOTE — Progress Notes (Signed)
Foley catheter removed per MD's order. after an hour, Pt. was able to void on her own with an amount of 300cc, post residual volume is 173ml.

## 2018-09-23 NOTE — Discharge Summary (Signed)
Physician Discharge Summary  Patient ID: Donna Mueller MRN: [id number] DOB/AGE: 1990/07/11 27 y.o.  Admit date: 09/18/2018 Discharge date: 09/23/2018  Admission Diagnoses:  Discharge Diagnoses:  Principal Problem:   Severe herpes labialis Active Problems:   Papillary thyroid carcinoma ([place])   Post-surgical hypothyroidism   Vulvar cellulitis   Urinary retention secondary to dysuria  Discharged Condition: stable  Hospital Course: Patient is a 28 year old Caucasian female, with past medical history significant for thyroid cancer status post thyroidectomy.  Apparently, patient was seen by her OB/GYN for genital herpes a week prior to presentation and was started on Zovirax and Keflex.  Patient presented with painful lesions around the labia, swelling of the labia and mons pubis and foul-smelling discharge.  Patient was admitted for further assessment and management.  On presentation, patient was started on Valtrex, IV ceftriaxone and doxycycline.  Test for gonorrhea and chlamydia came back negative, and ceftriaxone and doxycycline was discontinued.  HIV screening was negative.  Hepatitis screening was negative.  Herpes simplex virus 1 and 2 by PCR also came back negative.  Valtrex was continued during the hospital stay, and patient will complete course of therapy.  Patient may need continued suppression therapy, but will defer this to the primary care provider.  Due to significant pain around the genital area, patient developed urinary retention.  Foley catheter was inserted, and was removed just prior to discharge.  Patient was able to void afterwards.  The PCP will continue to monitor patient closely.  Patient has also been advised to contact the PCP or the hospital if she develops urinary retention or recognizes that she has not voided after several hours and the patient voiced understanding.  Patient and patient's husband were both counseled, and the infectivity of herpes virus was discussed  extensively.  Patient will be discharged to the care of the primary care provider.  Consults: neurology  Significant Diagnostic Studies:  Herpes simplex virus 1 and 2 PCR came back negative HIV screening was negative Chlamydia and gonorrhea came back negative. Hepatitis C antibody was negative. Hepatitis B surface antigen was negative.  CT scan of the pelvis with contrast revealed "Mild subcutaneous induration of the anterior labia majora and inferior mons pubis without abscess or soft tissue gas".  CT scan head was non-revealing.  Discharge Exam: Blood pressure 111/78, pulse 84, temperature 98.2 F (36.8 C), temperature source Oral, resp. rate 18, height 5\' 2"  (1.575 m), weight 56.3 kg, SpO2 99 %.  Disposition: Discharge disposition: 01-Home or Self Care  Discharge Instructions    Diet - low sodium heart healthy   Complete by:  As directed    Increase activity slowly   Complete by:  As directed      Allergies as of 09/23/2018   No Known Allergies     Medication List    STOP taking these medications   benzonatate 100 MG capsule Commonly known as:  TESSALON   cephALEXin 500 MG capsule Commonly known as:  KEFLEX   naproxen 375 MG tablet Commonly known as:  NAPROSYN     TAKE these medications   acetaminophen 500 MG tablet Commonly known as:  TYLENOL Take 500 mg by mouth every 6 (six) hours as needed for mild pain.   benzocaine 10 % mucosal gel Commonly known as:  ORAJEL Use as directed in the mouth or throat 4 (four) times daily as needed for mouth pain.   buPROPion 150 MG 24 hr tablet Commonly known as:  WELLBUTRIN XL Take 150 mg  by mouth daily.   docusate sodium 100 MG capsule Commonly known as:  COLACE Take 1 capsule (100 mg total) by mouth 2 (two) times daily.   Junel Fe 24 1-20 MG-MCG(24) tablet Generic drug:  Norethindrone Acetate-Ethinyl Estrad-FE Take 1 tablet by mouth daily.   lidocaine 4 % external solution Commonly known as:  XYLOCAINE Apply  topically 4 (four) times daily.   multivitamin with minerals Tabs tablet Take 1 tablet by mouth daily. Start taking on:  September 24, 2018   oxyCODONE 5 MG immediate release tablet Commonly known as:  Oxy IR/ROXICODONE Take 1 tablet (5 mg total) by mouth every 4 (four) hours as needed for moderate pain.   polyethylene glycol packet Commonly known as:  MIRALAX / GLYCOLAX Take 17 g by mouth daily. Start taking on:  September 24, 2018   rizatriptan 5 MG tablet Commonly known as:  MAXALT Take 5 mg by mouth as needed for migraine.   Synthroid 137 MCG tablet Generic drug:  levothyroxine Take 137 mcg by mouth daily.   valACYclovir 1000 MG tablet Commonly known as:  VALTREX Take 1 tablet (1,000 mg total) by mouth 2 (two) times daily for 6 days. What changed:  when to take this        Signed: Bonnell Public 09/23/2018, 2:14 PM

## 2018-09-23 NOTE — Progress Notes (Signed)
Donna Mueller to be D/C'd Home per MD order.  Discussed prescriptions and follow up appointments with the patient. Prescriptions given to patient, medication list explained in detail. Pt verbalized understanding.  Allergies as of 09/23/2018   No Known Allergies     Medication List    STOP taking these medications   benzonatate 100 MG capsule Commonly known as:  TESSALON   cephALEXin 500 MG capsule Commonly known as:  KEFLEX   naproxen 375 MG tablet Commonly known as:  NAPROSYN     TAKE these medications   acetaminophen 500 MG tablet Commonly known as:  TYLENOL Take 500 mg by mouth every 6 (six) hours as needed for mild pain.   benzocaine 10 % mucosal gel Commonly known as:  ORAJEL Use as directed in the mouth or throat 4 (four) times daily as needed for mouth pain.   buPROPion 150 MG 24 hr tablet Commonly known as:  WELLBUTRIN XL Take 150 mg by mouth daily.   docusate sodium 100 MG capsule Commonly known as:  COLACE Take 1 capsule (100 mg total) by mouth 2 (two) times daily.   Junel Fe 24 1-20 MG-MCG(24) tablet Generic drug:  Norethindrone Acetate-Ethinyl Estrad-FE Take 1 tablet by mouth daily.   lidocaine 4 % external solution Commonly known as:  XYLOCAINE Apply topically 4 (four) times daily.   multivitamin with minerals Tabs tablet Take 1 tablet by mouth daily. Start taking on:  September 24, 2018   oxyCODONE 5 MG immediate release tablet Commonly known as:  Oxy IR/ROXICODONE Take 1 tablet (5 mg total) by mouth every 4 (four) hours as needed for moderate pain.   polyethylene glycol packet Commonly known as:  MIRALAX / GLYCOLAX Take 17 g by mouth daily. Start taking on:  September 24, 2018   rizatriptan 5 MG tablet Commonly known as:  MAXALT Take 5 mg by mouth as needed for migraine.   Synthroid 137 MCG tablet Generic drug:  levothyroxine Take 137 mcg by mouth daily.   valACYclovir 1000 MG tablet Commonly known as:  VALTREX Take 1 tablet (1,000 mg total)  by mouth 2 (two) times daily for 6 days. What changed:  when to take this       Vitals:   09/23/18 0559 09/23/18 1000  BP: 102/65 111/78  Pulse: 70 84  Resp: 20 18  Temp: 98.4 F (36.9 C) 98.2 F (36.8 C)  SpO2: 100% 99%    Skin clean, dry and intact without evidence of skin break down, no evidence of skin tears noted. IV catheter discontinued intact. Site without signs and symptoms of complications. Dressing and pressure applied. Pt denies pain at this time. No complaints noted.  An After Visit Summary was printed and given to the patient. Patient escorted via [place], and D/C home via private auto.

## 2018-09-29 LAB — AEROBIC/ANAEROBIC CULTURE W GRAM STAIN (SURGICAL/DEEP WOUND)

## 2018-11-21 DIAGNOSIS — R109 Unspecified abdominal pain: Secondary | ICD-10-CM | POA: Diagnosis not present

## 2018-11-22 DIAGNOSIS — A09 Infectious gastroenteritis and colitis, unspecified: Secondary | ICD-10-CM | POA: Diagnosis not present

## 2019-01-09 DIAGNOSIS — E890 Postprocedural hypothyroidism: Secondary | ICD-10-CM | POA: Diagnosis not present

## 2019-01-09 DIAGNOSIS — C779 Secondary and unspecified malignant neoplasm of lymph node, unspecified: Secondary | ICD-10-CM | POA: Diagnosis not present

## 2019-01-09 DIAGNOSIS — Z85850 Personal history of malignant neoplasm of thyroid: Secondary | ICD-10-CM | POA: Diagnosis not present

## 2019-01-09 DIAGNOSIS — C73 Malignant neoplasm of thyroid gland: Secondary | ICD-10-CM | POA: Diagnosis not present

## 2019-03-05 ENCOUNTER — Other Ambulatory Visit: Payer: Self-pay

## 2019-03-05 ENCOUNTER — Emergency Department (HOSPITAL_COMMUNITY)
Admission: EM | Admit: 2019-03-05 | Discharge: 2019-03-05 | Disposition: A | Discharge status: Home / Self Care | Payer: BC Managed Care – PPO | Attending: Emergency Medicine | Admitting: Emergency Medicine

## 2019-03-05 DIAGNOSIS — R0602 Shortness of breath: Secondary | ICD-10-CM | POA: Diagnosis not present

## 2019-03-05 DIAGNOSIS — T7840XA Allergy, unspecified, initial encounter: Secondary | ICD-10-CM | POA: Insufficient documentation

## 2019-03-05 DIAGNOSIS — R42 Dizziness and giddiness: Secondary | ICD-10-CM | POA: Diagnosis not present

## 2019-03-05 DIAGNOSIS — Z79899 Other long term (current) drug therapy: Secondary | ICD-10-CM | POA: Insufficient documentation

## 2019-03-05 DIAGNOSIS — I10 Essential (primary) hypertension: Secondary | ICD-10-CM | POA: Diagnosis not present

## 2019-03-05 MED ORDER — FAMOTIDINE IN NACL 20-0.9 MG/50ML-% IV SOLN
20.0000 mg | Freq: Once | INTRAVENOUS | Status: AC
  Administered 2019-03-05: 20 mg via INTRAVENOUS
  Filled 2019-03-05: qty 50

## 2019-03-05 MED ORDER — METHYLPREDNISOLONE SODIUM SUCC 125 MG IJ SOLR
125.0000 mg | Freq: Once | INTRAMUSCULAR | Status: AC
  Administered 2019-03-05: 125 mg via INTRAVENOUS
  Filled 2019-03-05: qty 2

## 2019-03-05 NOTE — Discharge Instructions (Addendum)
Discontinue use of the multivitamin and probiotic that you took today. Continue taking Benadryl, Zyrtec, Pepcid at home for the next 3 days. Return to the ER for any new or worsening symptoms.

## 2019-03-05 NOTE — ED Provider Notes (Signed)
[facility] EMERGENCY DEPARTMENT Provider Note   CSN: [id number] Arrival date & time: 03/05/19  1046     History   Chief Complaint Chief Complaint  Patient presents with  . Allergic Reaction    HPI Donna Mueller is a 28 y.o. female.     28yo female brought in by EMS for allergic reaction. Patient reports taking a new multivitamin and probiotic for the first time this morning, reports onset of red rash to neck, tongue/mouth itching and feeling swollen within 15 minutes of taking the medicine. Patient was driving at the time, pulled into a firehouse, was given 50mg  benadryl and 0.3 Epi. Upon arrival in the ER, reports feeling much better, itching/swelling feeling in the mouth has resolved as well as the rash to her neck. No complaints at this time.      Past Medical History:  Diagnosis Date  . ADD (attention deficit disorder)   . Difficulty sleeping   . GERD (gastroesophageal reflux disease)   . Headache    HX MIGRAINES  . Knee pain    LEFT  . Papillary thyroid carcinoma [facility])     Patient Active Problem List   Diagnosis Date Noted  . Sepsis, Gram negative ([place]) 09/18/2018  . Vulvar cellulitis 09/18/2018  . Herpes labialis 09/18/2018  . Oral contraceptive use 07/12/2017  . Post-surgical hypothyroidism 07/12/2017  . Papillary thyroid carcinoma ([place]) 07/19/2015    Past Surgical History:  Procedure Laterality Date  . LYMPH NODE DISSECTION N/A 07/20/2015   Procedure: LYMPH NODE DISSECTION;  Surgeon: Armandina Gemma, MD;  Location: [facility];  Service: General;  Laterality: N/A;  . THYROIDECTOMY N/A 07/20/2015   Procedure: TOTAL THYROIDECTOMY WITH LIMITED CENTRAL COMPARTMENT  LYMPH NODE DISSECTION  AND AUTO-TRANSPLANTATION OF LEFT INFERIOR PARATHYROID ;  Surgeon: Armandina Gemma, MD;  Location: [facility];  Service: General;  Laterality: N/A;     OB History   No obstetric history on file.      Home Medications    Prior to Admission medications   Medication  Sig Start Date End Date Taking? Authorizing Provider  buPROPion (WELLBUTRIN XL) 150 MG 24 hr tablet Take 150 mg by mouth daily. 07/22/18  Yes [provider]  JUNEL FE 24 1-20 MG-MCG(24) tablet Take 1 tablet by mouth daily.  07/08/17  Yes [provider]  levothyroxine (SYNTHROID) 150 MCG tablet Take 150 mcg by mouth daily before breakfast.   Yes [provider]  acetaminophen (TYLENOL) 500 MG tablet Take 500 mg by mouth every 6 (six) hours as needed for mild pain.     [provider]  benzocaine (ORAJEL) 10 % mucosal gel Use as directed in the mouth or throat 4 (four) times daily as needed for mouth pain. Patient not taking: Reported on 03/05/2019 09/23/18   Dana Allan I, MD  docusate sodium (COLACE) 100 MG capsule Take 1 capsule (100 mg total) by mouth 2 (two) times daily. Patient not taking: Reported on 03/05/2019 09/23/18   Dana Allan I, MD  lidocaine (XYLOCAINE) 4 % external solution Apply topically 4 (four) times daily. Patient not taking: Reported on 03/05/2019 09/23/18   Dana Allan I, MD  Multiple Vitamin (MULTIVITAMIN WITH MINERALS) TABS tablet Take 1 tablet by mouth daily. Patient not taking: Reported on 03/05/2019 09/24/18   Dana Allan I, MD  oxyCODONE (OXY IR/ROXICODONE) 5 MG immediate release tablet Take 1 tablet (5 mg total) by mouth every 4 (four) hours as needed for moderate pain. Patient not taking: Reported  on 03/05/2019 09/23/18   Dana Allan I, MD  polyethylene glycol (MIRALAX / Floria Raveling) packet Take 17 g by mouth daily. Patient not taking: Reported on 03/05/2019 09/24/18   Dana Allan I, MD  rizatriptan (MAXALT) 5 MG tablet Take 5 mg by mouth as needed for migraine.  07/23/18   [provider]    Family History Family History  Problem Relation Age of Onset  . Glaucoma Mother   . Anxiety disorder Father   . Healthy Brother   . Thyroid disease Paternal Uncle   . Lung cancer Maternal Grandmother   .  Diabetes Maternal Grandfather     Social History Social History   Tobacco Use  . Smoking status: Never Smoker  . Smokeless tobacco: Never Used  Substance Use Topics  . Alcohol use: Yes    Comment: OCCASIONAL  . Drug use: No     Allergies   Patient has no known allergies.   Review of Systems Review of Systems  Constitutional: Negative for chills and fever.  HENT: Negative for sore throat, trouble swallowing and voice change.   Respiratory: Negative for shortness of breath.   Gastrointestinal: Negative for abdominal pain, nausea and vomiting.  Musculoskeletal: Negative for joint swelling.  Skin: Positive for rash. Negative for wound.  Allergic/Immunologic: Negative for immunocompromised state.  All other systems reviewed and are negative.    Physical Exam Updated Vital Signs BP 103/83   Pulse 95   Temp 99.1 F (37.3 C) (Oral)   Resp (!) 21   Ht 5\' 2"  (1.575 m)   Wt 53.5 kg   LMP 03/03/2019 (Exact Date)   SpO2 99%   BMI 21.58 kg/m   Physical Exam Vitals signs and nursing note reviewed.  Constitutional:      General: She is not in acute distress.    Appearance: She is well-developed. She is not diaphoretic.  HENT:     Head: Normocephalic and atraumatic.     Mouth/Throat:     Mouth: Mucous membranes are moist.     Pharynx: No oropharyngeal exudate or posterior oropharyngeal erythema.  Cardiovascular:     Rate and Rhythm: Regular rhythm. Tachycardia present.     Pulses: Normal pulses.     Heart sounds: Normal heart sounds.  Pulmonary:     Effort: Pulmonary effort is normal.     Breath sounds: Normal breath sounds.  Abdominal:     Tenderness: There is no abdominal tenderness.  Skin:    General: Skin is warm and dry.     Findings: No erythema or rash.  Neurological:     Mental Status: She is alert and oriented to person, place, and time.  Psychiatric:        Behavior: Behavior normal.      ED Treatments / Results  Labs (all labs ordered are  listed, but only abnormal results are displayed) Labs Reviewed - No data to display  EKG None  Radiology No results found.  Procedures Procedures (including critical care time)  Medications Ordered in ED Medications  famotidine (PEPCID) IVPB 20 mg premix (0 mg Intravenous Stopped 03/05/19 1149)  methylPREDNISolone sodium succinate (SOLU-MEDROL) 125 mg/2 mL injection 125 mg (125 mg Intravenous Given 03/05/19 1120)     Initial Impression / Assessment and Plan / ED Course  I have reviewed the triage vital signs and the nursing notes.  Pertinent labs & imaging results that were available during my care of the patient were reviewed by me and considered in my medical decision  making (see chart for details).  Clinical Course as of Mar 04 1354  Wed Mar 05, 7263  [id number] 28 year old female presents emergency room for possible allergic reaction.  Patient took a new multivitamin and probiotic for the first time today and 15 minutes later developed redness of her neck with feeling like her mouth was swelling and itching.  Denies any visible swelling at any point.  Patient stopped at the fire department, EMS was called, she was given Benadryl and epi, arrived at the emergency room symptom-free.  Patient was given Solu-Medrol for possible allergic reaction.  Review of patient's multivitamin and probiotic, does not contain niacin.  Patient remained symptom-free for 3-hour observation.,  Will be discharged with plan for continuing Benadryl, Zyrtec, Pepcid at home, return to ER for any new or worsening symptoms, do not take the multivitamin probiotic again.  Follow-up with PCP as needed.  Patient verbalizes understanding of discharge instructions and plan.   [LM]    Clinical Course User Index [LM] Tacy Learn, PA-C      Final Clinical Impressions(s) / ED Diagnoses   Final diagnoses:  Allergic reaction, initial encounter    ED Discharge Orders    None       Roque Lias 03/05/19  1355    Virgel Manifold, MD 03/06/19 1135

## 2019-03-05 NOTE — ED Triage Notes (Signed)
Pt here for allergic reaction lightheaded, had hives, shob. 0.3 epi given by FD. 50 Benadryl given by EMS. Recently started a new multivitamin and probiotic, took at 9 this morning and had sudden onset of symptoms 15 mins afterwards.

## 2019-03-05 NOTE — ED Notes (Signed)
AVS provided.  Discharge teaching regarding over the counter medications to continue for 3 days.  Follow up information provided.

## 2019-03-14 DIAGNOSIS — E890 Postprocedural hypothyroidism: Secondary | ICD-10-CM | POA: Diagnosis not present

## 2019-03-31 DIAGNOSIS — Z85850 Personal history of malignant neoplasm of thyroid: Secondary | ICD-10-CM | POA: Diagnosis not present

## 2019-03-31 DIAGNOSIS — E890 Postprocedural hypothyroidism: Secondary | ICD-10-CM | POA: Diagnosis not present

## 2019-04-28 DIAGNOSIS — Z85850 Personal history of malignant neoplasm of thyroid: Secondary | ICD-10-CM | POA: Diagnosis not present

## 2019-05-12 DIAGNOSIS — F330 Major depressive disorder, recurrent, mild: Secondary | ICD-10-CM | POA: Diagnosis not present

## 2019-05-12 DIAGNOSIS — F411 Generalized anxiety disorder: Secondary | ICD-10-CM | POA: Diagnosis not present

## 2019-05-12 DIAGNOSIS — E039 Hypothyroidism, unspecified: Secondary | ICD-10-CM | POA: Diagnosis not present

## 2019-08-04 DIAGNOSIS — E039 Hypothyroidism, unspecified: Secondary | ICD-10-CM | POA: Diagnosis not present

## 2019-08-04 DIAGNOSIS — Z130 Encounter for screening for diseases of the blood and blood-forming organs and certain disorders involving the immune mechanism: Secondary | ICD-10-CM | POA: Diagnosis not present

## 2019-08-04 DIAGNOSIS — Z13228 Encounter for screening for other metabolic disorders: Secondary | ICD-10-CM | POA: Diagnosis not present

## 2019-08-15 DIAGNOSIS — Z6822 Body mass index (BMI) 22.0-22.9, adult: Secondary | ICD-10-CM | POA: Diagnosis not present

## 2019-08-15 DIAGNOSIS — Z01419 Encounter for gynecological examination (general) (routine) without abnormal findings: Secondary | ICD-10-CM | POA: Diagnosis not present

## 2019-09-10 DIAGNOSIS — H40013 Open angle with borderline findings, low risk, bilateral: Secondary | ICD-10-CM | POA: Diagnosis not present

## 2019-09-15 ENCOUNTER — Ambulatory Visit: Payer: BC Managed Care – PPO | Admitting: Internal Medicine

## 2019-09-15 ENCOUNTER — Encounter: Payer: Self-pay | Admitting: Internal Medicine

## 2019-09-15 ENCOUNTER — Other Ambulatory Visit: Payer: Self-pay

## 2019-09-15 VITALS — BP 116/62 | HR 113 | Temp 98.2°F | Ht 62.0 in | Wt 124.6 lb

## 2019-09-15 DIAGNOSIS — E890 Postprocedural hypothyroidism: Secondary | ICD-10-CM | POA: Diagnosis not present

## 2019-09-15 DIAGNOSIS — C73 Malignant neoplasm of thyroid gland: Secondary | ICD-10-CM

## 2019-09-15 LAB — TSH: TSH: <0.01 u[IU]/mL — ABNORMAL LOW (ref 0.35–4.50)

## 2019-09-15 LAB — T4, FREE: Free T4: 1.35 ng/dL (ref 0.60–1.60)

## 2019-09-15 MED ORDER — LEVOTHYROXINE SODIUM 175 MCG PO CAPS: 175.0000 ug | ORAL_CAPSULE | Freq: Every day | ORAL | 3 refills | Status: DC

## 2019-09-15 NOTE — Progress Notes (Signed)
Name: Donna Mueller  MRN/ DOB: [id number], [birth year]    Age/ Sex: 29 y.o., female    PCP: Fanny Bien, MD   Reason for Endocrinology Evaluation: Hx of papillary thyroid cancer      Date of Initial Endocrinology Evaluation: 09/16/2019     HPI: Donna Mueller is a 29 y.o. female with a past medical history of Hx of papillary thyroid cancer. The patient presented for initial endocrinology clinic visit on 09/16/2019 for consultative assistance with her hx of papillary thyroid carcinoma.   Pt was diagnosed with thyroid papillary carcinoma in 2016. She is S/P total thyroidectomy in 07/2015, the tumor was 3.0 cm, 9/9 positive lymph nodes ( T2 N1a M0) . She is S/P RAI therapy for remnant ablation  10/2015.  She has an autotransplantation of the parathyroid gland to the left sternocleidomastoid mastoid with Dr. Harlow Asa  In 10/2016 Thyrogen stimulated WBS showed no evidence of iodine -avid metastatic thyroid cancer  08/30/2018 PET and WBS negative      She had seen Dr. Buddy Duty, Chalmers Cater  And Dr. Denton Lank in the past Paternal grandmother - Hypothyroidism    Levothyroxine 175 mcg - rarely misses it.   Weight fluctuates  Has occasional stomach issues but no constipation or diarrhea.   Has anxiety and depression -chronic  Does not take biotin   Denies local neck symptoms    HISTORY:  Past Medical History:  Past Medical History:  Diagnosis Date  . ADD (attention deficit disorder)   . Difficulty sleeping   . GERD (gastroesophageal reflux disease)   . Headache    HX MIGRAINES  . Knee pain    LEFT  . Papillary thyroid carcinoma [facility])    Past Surgical History:  Past Surgical History:  Procedure Laterality Date  . LYMPH NODE DISSECTION N/A 07/20/2015   Procedure: LYMPH NODE DISSECTION;  Surgeon: Armandina Gemma, MD;  [facility];  Service: General;  Laterality: N/A;  . THYROIDECTOMY N/A 07/20/2015   Procedure: TOTAL THYROIDECTOMY WITH LIMITED CENTRAL COMPARTMENT  LYMPH NODE  DISSECTION  AND AUTO-TRANSPLANTATION OF LEFT INFERIOR PARATHYROID ;  Surgeon: Armandina Gemma, MD;  Location: [facility];  Service: General;  Laterality: N/A;      Social History:  reports that she has never smoked. She has never used smokeless tobacco. She reports current alcohol use. She reports that she does not use drugs.  Family History: family history includes Anxiety disorder in her father; Diabetes in her maternal grandfather; Glaucoma in her mother; Healthy in her brother; Lung cancer in her maternal grandmother; Thyroid disease in her paternal uncle.   HOME MEDICATIONS: Allergies as of 09/15/2019   No Known Allergies     Medication List       Accurate as of September 15, 2019 11:59 PM. If you have any questions, ask your nurse or doctor.        STOP taking these medications   benzocaine 10 % mucosal gel Commonly known as: ORAJEL Stopped by: Dorita Sciara, MD   docusate sodium 100 MG capsule Commonly known as: COLACE Stopped by: Dorita Sciara, MD   levothyroxine 150 MCG tablet Commonly known as: SYNTHROID Replaced by: Levothyroxine Sodium 175 MCG Caps Stopped by: Dorita Sciara, MD   lidocaine 4 % external solution Commonly known as: XYLOCAINE Stopped by: Dorita Sciara, MD   multivitamin with minerals Tabs tablet Stopped by: Dorita Sciara, MD   oxyCODONE 5 MG immediate release tablet Commonly known as: Oxy  IR/ROXICODONE Stopped by: Dorita Sciara, MD   polyethylene glycol 17 g packet Commonly known as: MIRALAX / GLYCOLAX Stopped by: Dorita Sciara, MD     TAKE these medications   acetaminophen 500 MG tablet Commonly known as: TYLENOL Take 500 mg by mouth every 6 (six) hours as needed for mild pain.   buPROPion 150 MG 24 hr tablet Commonly known as: WELLBUTRIN XL Take 150 mg by mouth daily.   Junel Fe 24 1-20 MG-MCG(24) tablet Generic drug: Norethindrone Acetate-Ethinyl Estrad-FE Take 1 tablet by mouth daily.     Levothyroxine Sodium 175 MCG Caps Take 175 mcg by mouth daily. Replaces: levothyroxine 150 MCG tablet Started by: Dorita Sciara, MD   rizatriptan 5 MG tablet Commonly known as: MAXALT Take 5 mg by mouth as needed for migraine.   sertraline 25 MG tablet Commonly known as: ZOLOFT         REVIEW OF SYSTEMS: A comprehensive ROS was conducted with the patient and is negative except as per HPI and below:  ROS     OBJECTIVE:  VS: BP 116/62 (BP Location: Left Arm, Patient Position: Sitting, Cuff Size: Normal)   Pulse (!) 113   Temp 98.2 F (36.8 C)   Ht 5\' 2"  (1.575 m)   Wt 124 lb 9.6 oz (56.5 kg)   LMP 08/31/2019   SpO2 98%   BMI 22.79 kg/m    Wt Readings from Last 3 Encounters:  09/15/19 124 lb 9.6 oz (56.5 kg)  03/05/19 118 lb (53.5 kg)  09/22/18 124 lb 1.9 oz (56.3 kg)     EXAM: General: Pt appears well and is in NAD  Hydration: Well-hydrated with moist mucous membranes and good skin turgor  Eyes: External eye exam normal without stare, lid lag or exophthalmos.  EOM intact.  PERRL.  Ears, Nose, Throat: Hearing: Grossly intact bilaterally Dental: Good dentition  Throat: Clear without mass, erythema or exudate  Neck: General: Supple without adenopathy. Thyroid: Thyroid size normal.  No goiter or nodules appreciated. No thyroid bruit.  Lungs: Clear with good BS bilat with no rales, rhonchi, or wheezes  Heart: Auscultation: RRR.  Abdomen: Normoactive bowel sounds, soft, nontender, without masses or organomegaly palpable  Extremities: Gait and station: Normal gait  Digits and nails: No clubbing, cyanosis, petechiae, or nodes Head and neck: Normal alignment and mobility BL UE: Normal ROM and strength. BL LE: No pretibial edema normal ROM and strength.  Skin: Hair: Texture and amount normal with gender appropriate distribution Skin Inspection: No rashes, acanthosis nigricans/skin tags. No lipohypertrophy Skin Palpation: Skin temperature, texture, and  thickness normal to palpation  Neuro: Cranial nerves: II - XII grossly intact  Cerebellar: Normal coordination and movement; no tremor Motor: Normal strength throughout DTRs: 2+ and symmetric in UE without delay in relaxation phase  Mental Status: Judgment, insight: Intact Orientation: Oriented to time, place, and person Memory: Intact for recent and remote events Mood and affect: No depression, anxiety, or agitation     DATA REVIEWED:  Results for DAE, URIOSTE (MRN [id number]) as of 09/16/2019 12:39  Ref. Range 09/15/2019 14:44  TSH Latest Ref Range: 0.35 - 4.50 uIU/mL <0.01 (L)  T4,Free(Direct) Latest Ref Range: 0.60 - 1.60 ng/dL 1.35      01/09/2019 Tg Ab < 1.8 IU/mL Tg < 0.1 ng/mL     Old records , labs and images have been reviewed.   ASSESSMENT/PLAN/RECOMMENDATIONS:   1. Hx of Papillary Thyroid Carcinoma : S/P total thyroidectomy 2017 (T2 N1a M0)   -  Patient with excellent structural and biochemical response to treatment, her last whole-body scan was done in 08/2018 with no evidence of iodine avid cancer. -TSH goal 0.1-0.5 uIU/mL -TSH today suppressed we will adjust levothyroxine dose as below   -We will proceed with thyroid bed ultrasound   -We will proceed with Tg and Tg antibody check  Medications :  Levothyroxine 175 MCG, half a tablet on Sundays, 1 tablet Monday through Saturday   2 . Postoperative Hypothyroidism:  -Patient noted with tachycardia today -No local neck symptoms -Pt educated extensively on the correct way to take levothyroxine (first thing in the morning with water, 30 minutes before eating or taking other medications). - Pt encouraged to double dose the following day if she were to miss a dose given long half-life of levothyroxine.  Medications Levothyroxine 175 MCG, half a tablet on Sundays, 1 tablet Monday through Saturday   Follow-up in 6 months Signed electronically by: Mack Guise, MD  [facility]  [facility] [address] [place], [place] [address] Phone: [phone] FAX: [phone]   CC: Fanny Bien, [address] [place] [address] Phone: [phone] Fax: [phone]   Return to Endocrinology clinic as below: Future Appointments  Date Time Provider [place]  03/22/2020  9:30 AM Adileny Delon, Melanie Crazier, MD [facility] None

## 2019-09-15 NOTE — Patient Instructions (Addendum)
-   You are on levothyroxine - which is your thyroid hormone supplement. You MUST take this consistently.  You should take this first thing in the morning on an empty stomach with water. You should not take it with other medications. Wait 69min to 1hr prior to eating. If you are taking any vitamins - please take these in the evening.   If you miss a dose, please take your missed dose the following day (double the dose for that day). You should have a pill box for ONLY levothyroxine on your bedside table to help you remember to take your medications.   - If you have a positive pregnancy test please increase Levothyroxine by TWO additional Tablets a week

## 2019-09-16 ENCOUNTER — Encounter: Payer: Self-pay | Admitting: Internal Medicine

## 2019-09-16 DIAGNOSIS — E890 Postprocedural hypothyroidism: Secondary | ICD-10-CM | POA: Insufficient documentation

## 2019-09-16 LAB — THYROGLOBULIN ANTIBODY: Thyroglobulin Ab: 1 IU/mL (ref <=1)

## 2019-09-16 LAB — THYROGLOBULIN LEVEL: Thyroglobulin: <0.1 ng/mL — ABNORMAL LOW

## 2019-09-30 DIAGNOSIS — F411 Generalized anxiety disorder: Secondary | ICD-10-CM | POA: Diagnosis not present

## 2019-09-30 DIAGNOSIS — F330 Major depressive disorder, recurrent, mild: Secondary | ICD-10-CM | POA: Diagnosis not present

## 2019-09-30 DIAGNOSIS — R519 Headache, unspecified: Secondary | ICD-10-CM | POA: Diagnosis not present

## 2019-10-02 ENCOUNTER — Other Ambulatory Visit: Payer: Self-pay

## 2019-10-02 ENCOUNTER — Encounter: Payer: Self-pay | Admitting: Internal Medicine

## 2019-10-03 ENCOUNTER — Ambulatory Visit: Payer: BC Managed Care – PPO | Admitting: Internal Medicine

## 2019-10-14 DIAGNOSIS — N911 Secondary amenorrhea: Secondary | ICD-10-CM | POA: Diagnosis not present

## 2019-10-22 ENCOUNTER — Encounter: Payer: Self-pay | Admitting: Internal Medicine

## 2019-10-27 ENCOUNTER — Other Ambulatory Visit: Payer: Self-pay | Admitting: Internal Medicine

## 2019-10-27 ENCOUNTER — Encounter: Payer: Self-pay | Admitting: Internal Medicine

## 2019-10-27 MED ORDER — LEVOTHYROXINE SODIUM 175 MCG PO TABS: 175.0000 ug | ORAL_TABLET | Freq: Every day | ORAL | 3 refills | Status: DC

## 2019-10-27 MED ORDER — LEVOTHYROXINE SODIUM 175 MCG PO CAPS: 175.0000 ug | ORAL_CAPSULE | Freq: Every day | ORAL | 3 refills | Status: DC

## 2019-10-30 DIAGNOSIS — Z3481 Encounter for supervision of other normal pregnancy, first trimester: Secondary | ICD-10-CM | POA: Diagnosis not present

## 2019-10-30 DIAGNOSIS — Z3685 Encounter for antenatal screening for Streptococcus B: Secondary | ICD-10-CM | POA: Diagnosis not present

## 2019-10-30 LAB — OB RESULTS CONSOLE RUBELLA ANTIBODY, IGM: Rubella: IMMUNE

## 2019-10-30 LAB — OB RESULTS CONSOLE ANTIBODY SCREEN: Antibody Screen: NEGATIVE

## 2019-10-30 LAB — OB RESULTS CONSOLE GC/CHLAMYDIA
Chlamydia: NEGATIVE
Gonorrhea: NEGATIVE

## 2019-10-30 LAB — OB RESULTS CONSOLE HIV ANTIBODY (ROUTINE TESTING): HIV: NONREACTIVE

## 2019-10-30 LAB — OB RESULTS CONSOLE ABO/RH: RH Type: POSITIVE

## 2019-10-30 LAB — OB RESULTS CONSOLE HEPATITIS B SURFACE ANTIGEN: Hepatitis B Surface Ag: NEGATIVE

## 2019-10-30 LAB — OB RESULTS CONSOLE RPR: RPR: NONREACTIVE

## 2019-11-11 DIAGNOSIS — Z124 Encounter for screening for malignant neoplasm of cervix: Secondary | ICD-10-CM | POA: Diagnosis not present

## 2019-11-11 DIAGNOSIS — Z331 Pregnant state, incidental: Secondary | ICD-10-CM | POA: Diagnosis not present

## 2019-11-11 DIAGNOSIS — Z3400 Encounter for supervision of normal first pregnancy, unspecified trimester: Secondary | ICD-10-CM | POA: Diagnosis not present

## 2019-11-11 DIAGNOSIS — Z113 Encounter for screening for infections with a predominantly sexual mode of transmission: Secondary | ICD-10-CM | POA: Diagnosis not present

## 2019-11-12 ENCOUNTER — Encounter: Payer: Self-pay | Admitting: Internal Medicine

## 2019-11-17 ENCOUNTER — Encounter: Payer: Self-pay | Admitting: Internal Medicine

## 2019-11-17 ENCOUNTER — Ambulatory Visit (INDEPENDENT_AMBULATORY_CARE_PROVIDER_SITE_OTHER): Payer: BC Managed Care – PPO | Admitting: Internal Medicine

## 2019-11-17 ENCOUNTER — Other Ambulatory Visit: Payer: Self-pay

## 2019-11-17 VITALS — BP 104/64 | HR 93 | Temp 98.2°F | Ht 62.0 in | Wt 123.2 lb

## 2019-11-17 DIAGNOSIS — C73 Malignant neoplasm of thyroid gland: Secondary | ICD-10-CM | POA: Diagnosis not present

## 2019-11-17 DIAGNOSIS — E890 Postprocedural hypothyroidism: Secondary | ICD-10-CM | POA: Diagnosis not present

## 2019-11-17 LAB — TSH: TSH: <0.01 u[IU]/mL — ABNORMAL LOW (ref 0.35–4.50)

## 2019-11-17 MED ORDER — LEVOTHYROXINE SODIUM 125 MCG PO TABS: 125.0000 ug | ORAL_TABLET | Freq: Every day | ORAL | 4 refills | Status: DC

## 2019-11-17 NOTE — Progress Notes (Signed)
Name: Donna Mueller  MRN/ DOB: [id number], 08/29/1990    Age/ Sex: 29 y.o., female     PCP: Fanny Bien, MD   Reason for Endocrinology Evaluation: Hx of PTC     Initial Endocrinology Clinic Visit: 09/16/2019    PATIENT IDENTIFIER: [name] is a 29 y.o., female with a past medical history of PTC. She has followed with [facility] since 09/16/2019 for consultative assistance with management of her Hx of PTC.   HISTORICAL SUMMARY:   Pt was diagnosed with thyroid papillary carcinoma in 2016. She is S/P total thyroidectomy in 07/2015, the tumor was 3.0 cm, 9/9 positive lymph nodes ( T2 N1a M0) . She is S/P RAI therapy for remnant ablation  10/2015.  She has an autotransplantation of the parathyroid gland to the left sternocleidomastoid mastoid with Dr. Harlow Asa  In 10/2016 Thyrogen stimulated WBS showed no evidence of iodine -avid metastatic thyroid cancer  08/30/2018 PET and WBS negative      She had seen Dr. Buddy Duty, Chalmers Cater  And Dr. Denton Lank in the past Paternal grandmother - Hypothyroidism  SUBJECTIVE:   During last visit (09/16/2019): Reduced Levothyroxine 175 to half a tablet on Sundays and 1 tablet rest of the week   Today (11/17/2019):  Donna Mueller is here for a f/u on post-operative hypothyroidism secondary to PTC. She is 11.1 Weeks of gestation   She is having nausea but no vomiting   She is currently on Levothyroxine 175  But has been taking 1.5 tabs on Sunday but 1 tablet the rest of week   No local neck enlargement  Has occasional constipation    ROS:  As per HPI.   HISTORY:  Past Medical History:  Past Medical History:  Diagnosis Date  . ADD (attention deficit disorder)   . Difficulty sleeping   . GERD (gastroesophageal reflux disease)   . Headache    HX MIGRAINES  . Knee pain    LEFT  . Papillary thyroid carcinoma [facility])    Past Surgical History:  Past Surgical History:  Procedure Laterality Date  . LYMPH NODE DISSECTION N/A  07/20/2015   Procedure: LYMPH NODE DISSECTION;  Surgeon: Armandina Gemma, MD;  [facility]: [facility];  Service: General;  Laterality: N/A;  . THYROIDECTOMY N/A 07/20/2015   Procedure: TOTAL THYROIDECTOMY WITH LIMITED CENTRAL COMPARTMENT  LYMPH NODE DISSECTION  AND AUTO-TRANSPLANTATION OF LEFT INFERIOR PARATHYROID ;  Surgeon: Armandina Gemma, MD;  Location: [facility];  Service: General;  Laterality: N/A;    Social History:  reports that she has never smoked. She has never used smokeless tobacco. She reports current alcohol use. She reports that she does not use drugs. Family History:  Family History  Problem Relation Age of Onset  . Glaucoma Mother   . Anxiety disorder Father   . Healthy Brother   . Thyroid disease Paternal Uncle   . Lung cancer Maternal Grandmother   . Diabetes Maternal Grandfather      HOME MEDICATIONS: Allergies as of 11/17/2019   No Known Allergies     Medication List       Accurate as of Nov 17, 2019  4:41 PM. If you have any questions, ask your nurse or doctor.        STOP taking these medications   Junel Fe 24 1-20 MG-MCG(24) tablet Generic drug: Norethindrone Acetate-Ethinyl Estrad-FE Stopped by: Dorita Sciara, MD     TAKE these medications   acetaminophen 500 MG tablet Commonly known as: TYLENOL  Take 500 mg by mouth every 6 (six) hours as needed for mild pain.   buPROPion 150 MG 24 hr tablet Commonly known as: WELLBUTRIN XL Take 150 mg by mouth daily.   levothyroxine 175 MCG tablet Commonly known as: SYNTHROID Take 1 tablet (175 mcg total) by mouth daily.   PRE-NATAL PO Take by mouth.   rizatriptan 5 MG tablet Commonly known as: MAXALT Take 5 mg by mouth as needed for migraine.   sertraline 25 MG tablet Commonly known as: ZOLOFT         OBJECTIVE:   PHYSICAL EXAM: VS: BP 104/64 (BP Location: Left Arm, Patient Position: Sitting, Cuff Size: Normal)   Pulse 93   Temp 98.2 F (36.8 C)   Ht '5\' 2"'  (1.575 m)   Wt 123 lb 3.2 oz (55.9 kg)    LMP 08/31/2019   SpO2 99%   BMI 22.53 kg/m    EXAM: General: Pt appears well and is in NAD  Neck: General: Supple without adenopathy. Thyroid: Thyroid is surgically removed  Lungs: Clear with good BS bilat with no rales, rhonchi, or wheezes  Heart: Auscultation: RRR.  Abdomen: Normoactive bowel sounds, soft, nontender, without masses or organomegaly palpable  Extremities:  BL LE: No pretibial edema normal ROM and strength.  Neuro: Cranial nerves: II - XII grossly intact  Motor: Normal strength throughout DTRs: 2+ and symmetric in UE without delay in relaxation phase  Mental Status: Judgment, insight: Intact Orientation: Oriented to time, place, and person  Mood and affect: No depression, anxiety, or agitation     DATA REVIEWED: Results for Donna, Mueller (MRN [id number]) as of 11/17/2019 16:41  Ref. Range 09/15/2019 14:44 11/17/2019 15:06  TSH Latest Ref Range: 0.35 - 4.50 uIU/mL <0.01 (L) <0.01 (L)    ASSESSMENT / PLAN / RECOMMENDATIONS:   1. Hx of Papillary Thyroid Carcinoma : S/P total thyroidectomy 2017 (T2 N1a M0)   -Patient with excellent structural and biochemical response to treatment, her last whole-body scan was done in 08/2018 with no evidence of iodine avid cancer. -TSH goal 0.1-0.5 uIU/mL -TSH today suppressed, unfortunately she had mis-read my instruction and instead of taking HALF a tablet of levothryoxine 175 mcg on Sundays, she has been taking one and a half. We will adjust levothyroxine dose as below   -We will proceed with Tg and Tg antibody check - Thyroid bed ultrasound pending   Medications :  Stop  Levothyroxine 175 MCG Start Levothyroxine 125 mcg daily     2 . Postoperative Hypothyroidism, Pt in 1st Trimester :  -Patient noted with tachycardia today -No local neck symptoms -Pt educated extensively on the correct way to take levothyroxine (first thing in the morning with water, 30 minutes before eating or taking other medications). - Pt  encouraged to double dose the following day if she were to miss a dose given long half-life of levothyroxine.  Medications Levothyroxine 125 mcg daily     Labs in 6 weeks  F/Uin 3 months   Signed electronically by: Mack Guise, MD  [facility]  [facility] [address] [place], [place] [address] Phone: [phone] FAX: [phone]      CC: Fanny Bien, [address] [place] [address] Phone: [phone]  Fax: [phone]   Return to Endocrinology clinic as below: Future Appointments  Date Time Provider [place]  12/29/2019 10:15 AM [facility] None  03/01/2020  9:50 AM Ismahan Lippman, Melanie Crazier, MD  [facility] None

## 2019-11-17 NOTE — Patient Instructions (Signed)

## 2019-11-18 LAB — T4: T4, Total: 17.5 ug/dL — ABNORMAL HIGH (ref 5.1–11.9)

## 2019-11-18 LAB — THYROGLOBULIN LEVEL: Thyroglobulin: <0.1 ng/mL — ABNORMAL LOW

## 2019-11-18 LAB — THYROGLOBULIN ANTIBODY: Thyroglobulin Ab: 1 IU/mL (ref <=1)

## 2019-11-25 DIAGNOSIS — Z3A12 12 weeks gestation of pregnancy: Secondary | ICD-10-CM | POA: Diagnosis not present

## 2019-11-25 DIAGNOSIS — Z36 Encounter for antenatal screening for chromosomal anomalies: Secondary | ICD-10-CM | POA: Diagnosis not present

## 2019-11-25 DIAGNOSIS — Z3682 Encounter for antenatal screening for nuchal translucency: Secondary | ICD-10-CM | POA: Diagnosis not present

## 2019-12-29 ENCOUNTER — Other Ambulatory Visit (INDEPENDENT_AMBULATORY_CARE_PROVIDER_SITE_OTHER): Payer: BC Managed Care – PPO

## 2019-12-29 ENCOUNTER — Other Ambulatory Visit: Payer: Self-pay

## 2019-12-29 ENCOUNTER — Encounter: Payer: Self-pay | Admitting: Internal Medicine

## 2019-12-29 DIAGNOSIS — E890 Postprocedural hypothyroidism: Secondary | ICD-10-CM | POA: Diagnosis not present

## 2019-12-29 LAB — TSH: TSH: 0.19 u[IU]/mL — ABNORMAL LOW (ref 0.35–4.50)

## 2019-12-29 MED ORDER — LEVOTHYROXINE SODIUM 100 MCG PO TABS: 100.0000 ug | ORAL_TABLET | Freq: Every day | ORAL | 3 refills | Status: DC

## 2019-12-30 LAB — T4: T4, Total: 10.5 ug/dL (ref 5.1–11.9)

## 2020-01-05 DIAGNOSIS — Z3A18 18 weeks gestation of pregnancy: Secondary | ICD-10-CM | POA: Diagnosis not present

## 2020-01-05 DIAGNOSIS — Z363 Encounter for antenatal screening for malformations: Secondary | ICD-10-CM | POA: Diagnosis not present

## 2020-01-05 DIAGNOSIS — Z361 Encounter for antenatal screening for raised alphafetoprotein level: Secondary | ICD-10-CM | POA: Diagnosis not present

## 2020-02-02 DIAGNOSIS — Z362 Encounter for other antenatal screening follow-up: Secondary | ICD-10-CM | POA: Diagnosis not present

## 2020-02-23 ENCOUNTER — Ambulatory Visit (INDEPENDENT_AMBULATORY_CARE_PROVIDER_SITE_OTHER): Payer: BC Managed Care – PPO | Admitting: Internal Medicine

## 2020-02-23 ENCOUNTER — Other Ambulatory Visit: Payer: Self-pay

## 2020-02-23 ENCOUNTER — Encounter: Payer: Self-pay | Admitting: Internal Medicine

## 2020-02-23 VITALS — BP 104/60 | HR 88 | Ht 62.0 in | Wt 135.8 lb

## 2020-02-23 DIAGNOSIS — C73 Malignant neoplasm of thyroid gland: Secondary | ICD-10-CM

## 2020-02-23 DIAGNOSIS — E890 Postprocedural hypothyroidism: Secondary | ICD-10-CM

## 2020-02-23 LAB — TSH: TSH: 7.17 u[IU]/mL — ABNORMAL HIGH (ref 0.35–4.50)

## 2020-02-23 MED ORDER — LEVOTHYROXINE SODIUM 112 MCG PO TABS: 112.0000 ug | ORAL_TABLET | Freq: Every day | ORAL | 6 refills | Status: DC

## 2020-02-23 NOTE — Progress Notes (Signed)
Name: TASHE PURDON  MRN/ DOB: [id number], 07-04-1991    Age/ Sex: 29 y.o., female     PCP: Fanny Bien, MD   Reason for Endocrinology Evaluation: Hx of PTC     Initial Endocrinology Clinic Visit: 09/16/2019    PATIENT IDENTIFIER: Ms. DEVYN GRIFFING is a 29 y.o., female with a past medical history of PTC. She has followed with [facility] since 09/16/2019 for consultative assistance with management of her Hx of PTC.   HISTORICAL SUMMARY:   Pt was diagnosed with thyroid papillary carcinoma in 2016. She is S/P total thyroidectomy in 07/2015, the tumor was 3.0 cm, 9/9 positive lymph nodes ( T2 N1a M0) . She is S/P RAI therapy for remnant ablation  10/2015.  She has an autotransplantation of the parathyroid gland to the left sternocleidomastoid mastoid with Dr. Harlow Asa  In 10/2016 Thyrogen stimulated WBS showed no evidence of iodine -avid metastatic thyroid cancer  08/30/2018 PET and WBS negative      She had seen Dr. Buddy Duty, Chalmers Cater  And Dr. Denton Lank in the past Paternal grandmother - Hypothyroidism  SUBJECTIVE:    Today (02/23/2020):  Ms. Aguado is here for a f/u on post-operative hypothyroidism secondary to PTC. She is 25.1 Weeks of gestation   She has been gaining weight appropriately  Nausea is better  No constipation No local neck symptoms   She is currently on Levothyroxine 100     She is having a boy Ryder  (06/03/2020)   ROS:  As per HPI.   HISTORY:  Past Medical History:  Past Medical History:  Diagnosis Date  . ADD (attention deficit disorder)   . Difficulty sleeping   . GERD (gastroesophageal reflux disease)   . Headache    HX MIGRAINES  . Knee pain    LEFT  . Papillary thyroid carcinoma [facility])    Past Surgical History:  Past Surgical History:  Procedure Laterality Date  . LYMPH NODE DISSECTION N/A 07/20/2015   Procedure: LYMPH NODE DISSECTION;  Surgeon: Armandina Gemma, MD;  Location: [facility];  Service: General;  Laterality: N/A;  .  THYROIDECTOMY N/A 07/20/2015   Procedure: TOTAL THYROIDECTOMY WITH LIMITED CENTRAL COMPARTMENT  LYMPH NODE DISSECTION  AND AUTO-TRANSPLANTATION OF LEFT INFERIOR PARATHYROID ;  Surgeon: Armandina Gemma, MD;  Location: [facility];  Service: General;  Laterality: N/A;    Social History:  reports that she has never smoked. She has never used smokeless tobacco. She reports current alcohol use. She reports that she does not use drugs. Family History:  Family History  Problem Relation Age of Onset  . Glaucoma Mother   . Anxiety disorder Father   . Healthy Brother   . Thyroid disease Paternal Uncle   . Lung cancer Maternal Grandmother   . Diabetes Maternal Grandfather      HOME MEDICATIONS: Allergies as of 02/23/2020   No Known Allergies     Medication List       Accurate as of February 23, 2020  3:03 PM. If you have any questions, ask your nurse or doctor.        acetaminophen 500 MG tablet Commonly known as: TYLENOL Take 500 mg by mouth every 6 (six) hours as needed for mild pain.   buPROPion 150 MG 24 hr tablet Commonly known as: WELLBUTRIN XL Take 150 mg by mouth daily.   levothyroxine 100 MCG tablet Commonly known as: SYNTHROID Take 1 tablet (100 mcg total) by mouth daily. To replace the 125 mcg  PRE-NATAL PO Take by mouth.   rizatriptan 5 MG tablet Commonly known as: MAXALT Take 5 mg by mouth as needed for migraine.   sertraline 25 MG tablet Commonly known as: ZOLOFT         OBJECTIVE:   PHYSICAL EXAM: VS: Ht '5\' 2"'  (1.575 m)   LMP 08/31/2019   BMI 22.53 kg/m    EXAM: General: Pt appears well and is in NAD  Neck: General: Supple without adenopathy. Thyroid: Thyroid is surgically removed  Lungs: Clear with good BS bilat with no rales, rhonchi, or wheezes  Heart: Auscultation: RRR.  Abdomen: Normoactive bowel sounds, soft, nontender, without masses or organomegaly palpable  Extremities:  BL LE: No pretibial edema normal ROM and strength.  Neuro: Cranial  nerves: II - XII grossly intact  Motor: Normal strength throughout DTRs: 2+ and symmetric in UE without delay in relaxation phase  Mental Status: Judgment, insight: Intact Orientation: Oriented to time, place, and person  Mood and affect: No depression, anxiety, or agitation     DATA REVIEWED: Results for CIARA, KAGAN (MRN [id number]) as of 02/23/2020 17:08  Ref. Range 09/15/2019 14:44 11/17/2019 15:06 12/29/2019 10:30 02/23/2020 15:20  TSH Latest Ref Range: 0.35 - 4.50 uIU/mL <0.01 (L) <0.01 (L) 0.19 (L) 7.17 (H)     ASSESSMENT / PLAN / RECOMMENDATIONS:   1. Hx of Papillary Thyroid Carcinoma : S/P total thyroidectomy 2017 (T2 N1a M0)   -Patient with excellent structural and biochemical response to treatment, her last whole-body scan was done in 08/2018 with no evidence of iodine avid cancer. -TSH goal 0.1-0.5 uIU/mL -  Tg and Tb Ab undetectable - Ultrasound order 09/2019 has not been performed.   Medications :  Stop  Levothyroxine 100 Start Levothyroxine 112 mcg daily     2 . Postoperative Hypothyroidism, Pt in 2nd Trimester :  -Patient noted with tachycardia today -No local neck symptoms -Pt educated extensively on the correct way to take levothyroxine (first thing in the morning with water, 30 minutes before eating or taking other medications). - Pt encouraged to double dose the following day if she were to miss a dose given long half-life of levothyroxine.  Medications Levothyroxine 112 mcg daily      F/Uin 2 months   Signed electronically by: Mack Guise, MD  [facility]  [facility] [address] [place], [place] [address] Phone: [phone] FAX: [phone]      CC: Fanny Bien, [address] [place] [address] Phone: [phone]  Fax: [phone]   Return to Endocrinology clinic as below: No future appointments.

## 2020-02-23 NOTE — Patient Instructions (Signed)

## 2020-03-01 ENCOUNTER — Ambulatory Visit: Payer: BC Managed Care – PPO | Admitting: Internal Medicine

## 2020-03-08 DIAGNOSIS — Z3480 Encounter for supervision of other normal pregnancy, unspecified trimester: Secondary | ICD-10-CM | POA: Diagnosis not present

## 2020-03-08 DIAGNOSIS — Z23 Encounter for immunization: Secondary | ICD-10-CM | POA: Diagnosis not present

## 2020-03-22 ENCOUNTER — Ambulatory Visit: Payer: BC Managed Care – PPO | Admitting: Internal Medicine

## 2020-04-05 DIAGNOSIS — N898 Other specified noninflammatory disorders of vagina: Secondary | ICD-10-CM | POA: Diagnosis not present

## 2020-04-05 DIAGNOSIS — D649 Anemia, unspecified: Secondary | ICD-10-CM | POA: Diagnosis not present

## 2020-04-05 DIAGNOSIS — N760 Acute vaginitis: Secondary | ICD-10-CM | POA: Diagnosis not present

## 2020-04-10 IMAGING — CT CT HEAD WITHOUT CONTRAST
4 series · 17 of 47 positions shown, 19 images · non-contrast
Comparison: None.

CLINICAL DATA: Reported intracranial hemorrhage.  Follow-up exam.

EXAM:
CT HEAD WITHOUT CONTRAST
TECHNIQUE: Contiguous axial images were obtained from the base of the skull
through the vertex without intravenous contrast.

[Series 4: head without · axial · non-contrast · 0.42mm/px · z∈[-75,+45]mm · 7 of 33 slices shown, 9 images]
[im 5/33  brain]
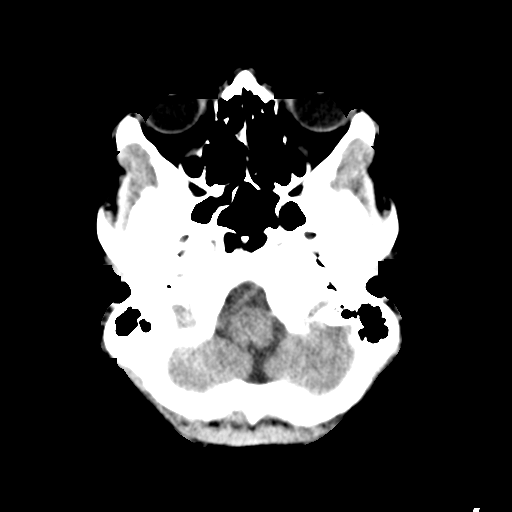
[im 5/33  bone]
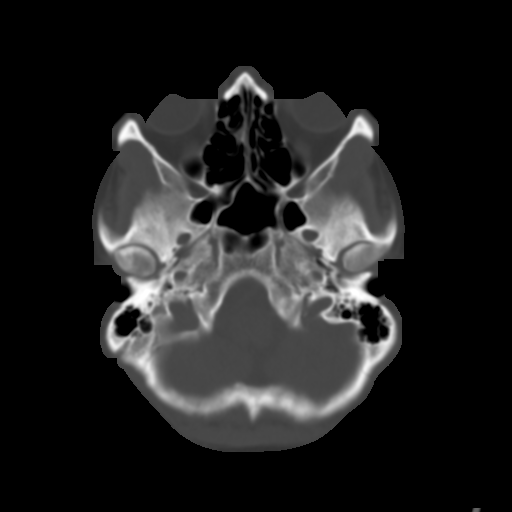
[im 9/33  brain]
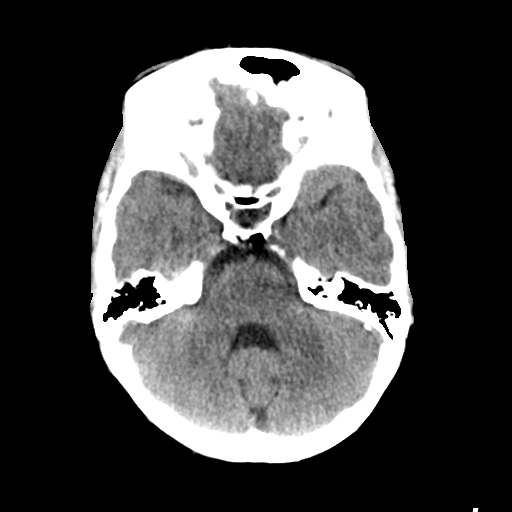
[im 13/33  brain]
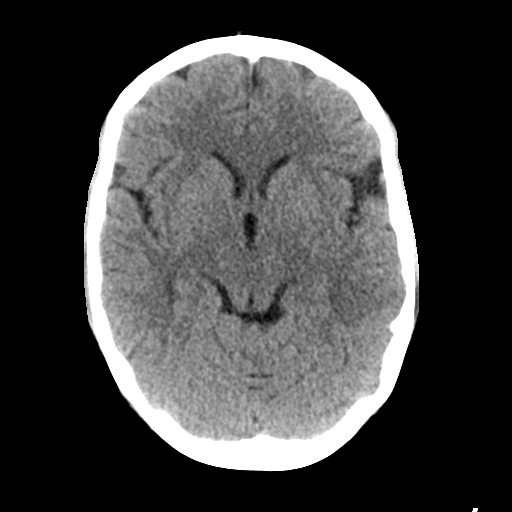
[im 17/33  brain]
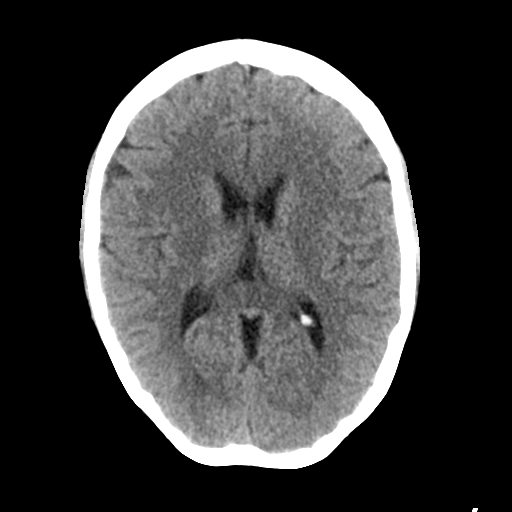
[im 21/33  brain]
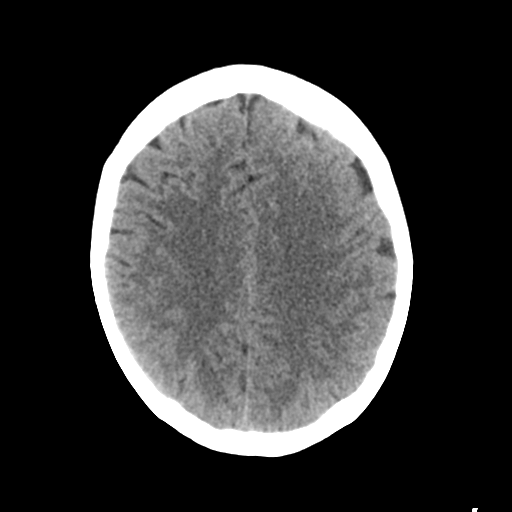
[im 21/33  bone]
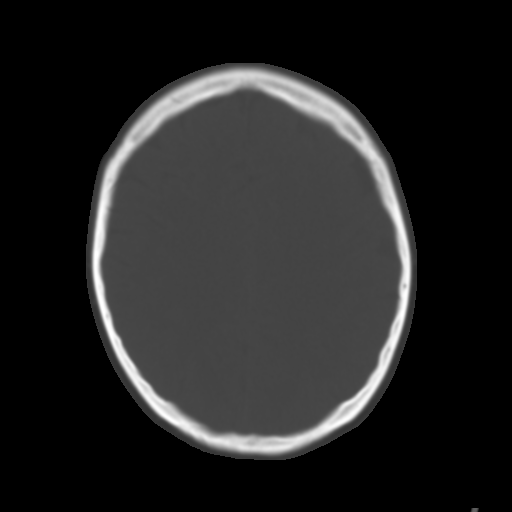
[im 25/33  brain]
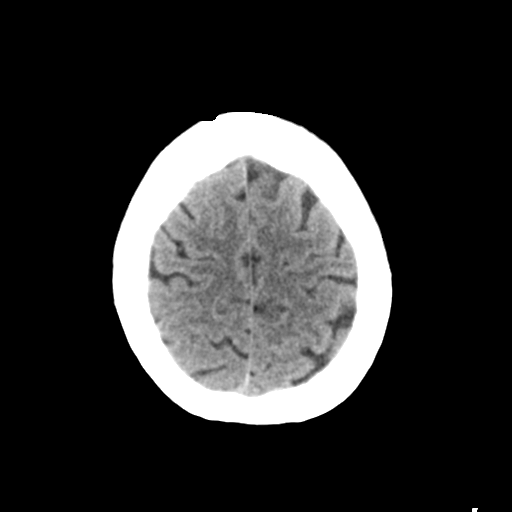
[im 29/33  brain]
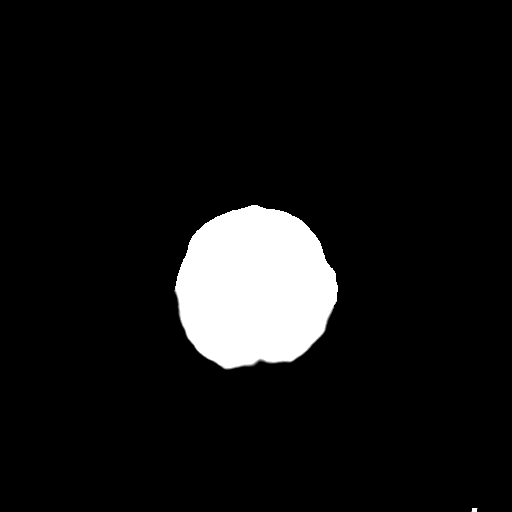

[Series 5: head bone · axial · 0.42mm/px · z∈[-79,-23]mm · 4 of 82 slices shown]
[im 9/82  bone]
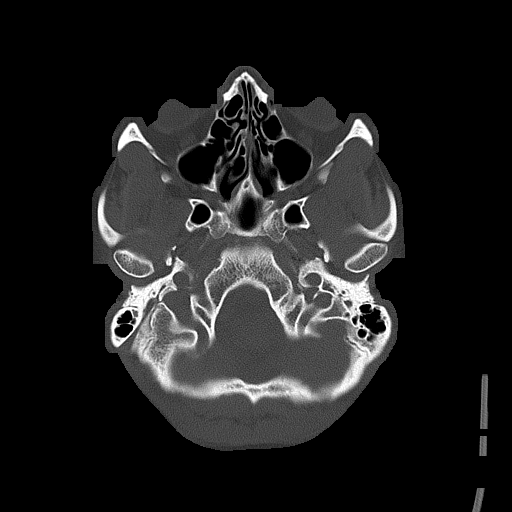
[im 17/82  bone]
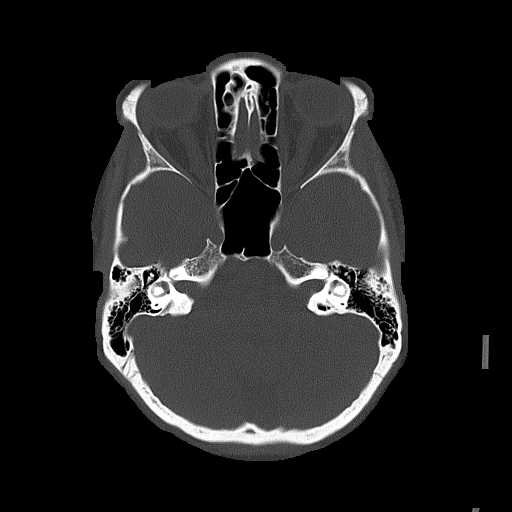
[im 25/82  bone]
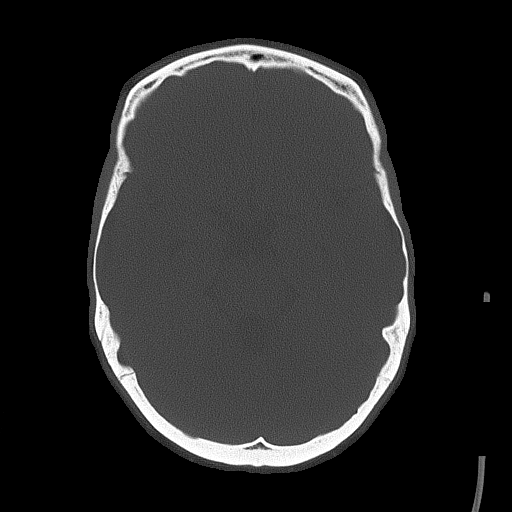
[im 37/82  bone]
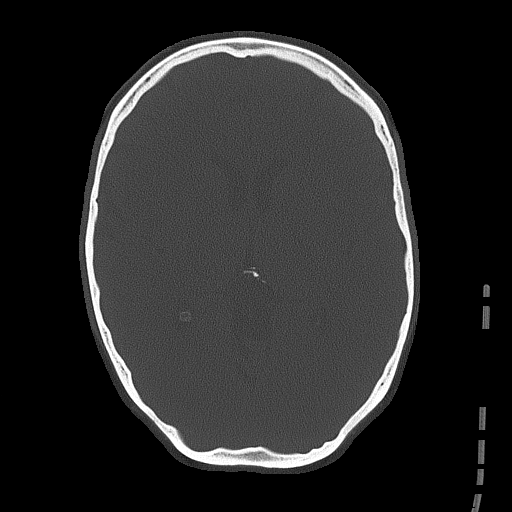

[Series 6: head without cor · coronal · non-contrast · 0.32mm/px · 3 of 67 slices shown]
[im 23/67  brain]
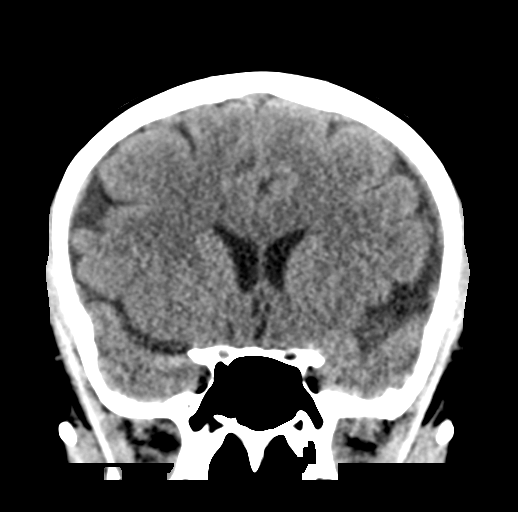
[im 30/67  brain]
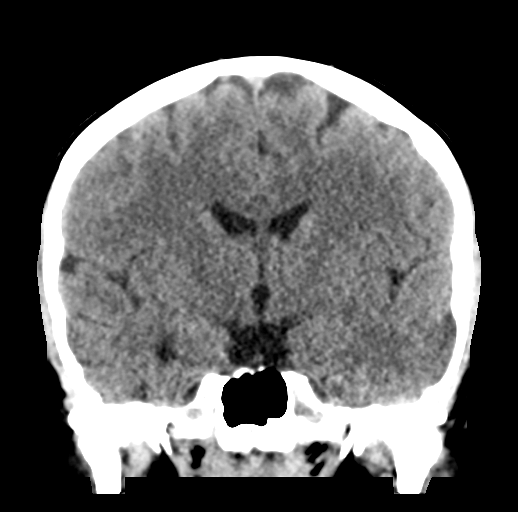
[im 37/67  brain]
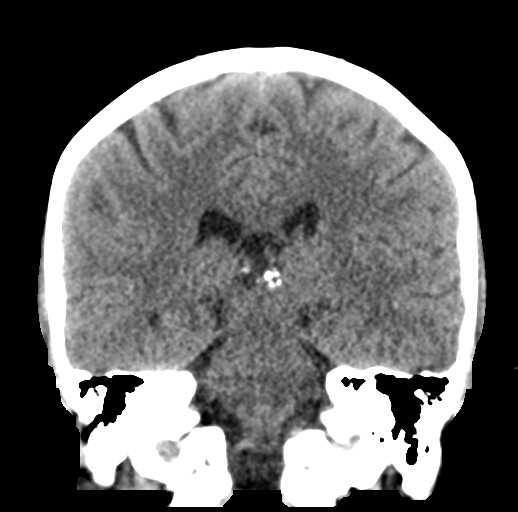

[Series 7: head without sag · sagittal · non-contrast · 0.32mm/px · 3 of 60 slices shown]
[im 20/60  brain]
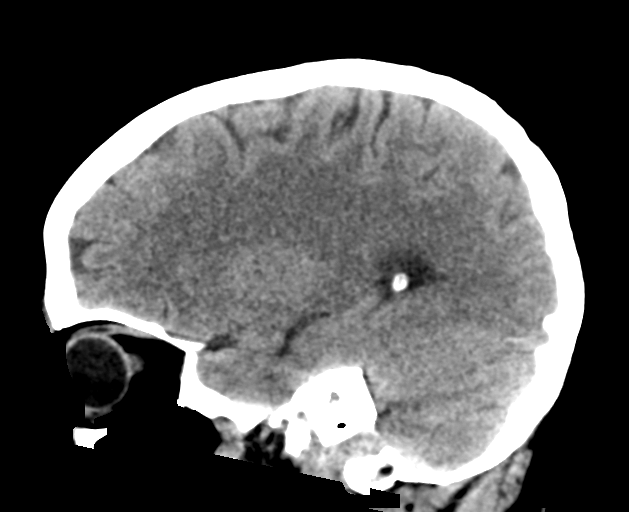
[im 30/60  brain]
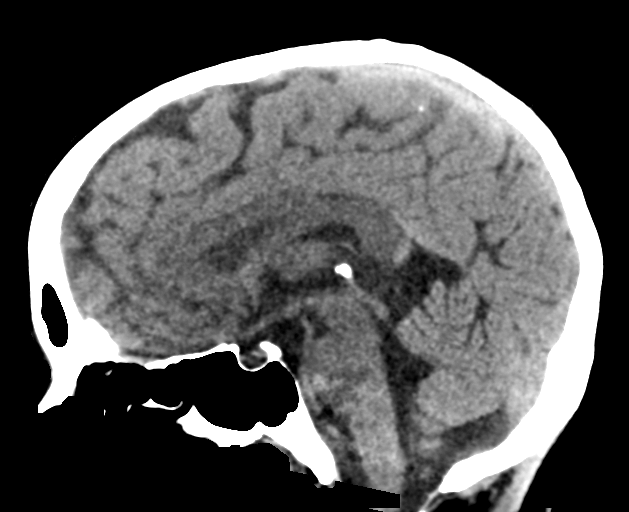
[im 40/60  brain]
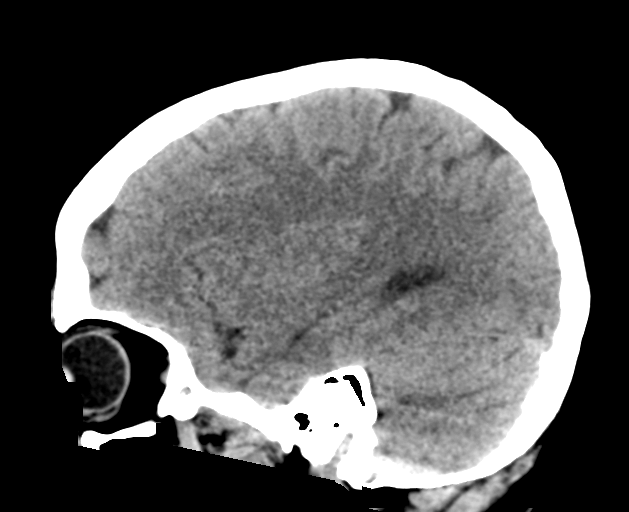

[17 of 47 positions shown; findings below may reference images not displayed]

FINDINGS: Brain: No evidence of acute infarction, hemorrhage, hydrocephalus,
extra-axial collection or mass lesion/mass effect.

Vascular: No hyperdense vessel or unexpected calcification.

Skull: Normal. Negative for fracture or focal lesion.

Sinuses/Orbits: Globes and orbits are unremarkable. Visualized
sinuses and mastoid air cells are clear.

Other: None.
IMPRESSION: 1. Normal unenhanced CT scan of the brain. Specifically, no
intracranial hemorrhage.

## 2020-05-03 ENCOUNTER — Ambulatory Visit (INDEPENDENT_AMBULATORY_CARE_PROVIDER_SITE_OTHER): Payer: BC Managed Care – PPO | Admitting: Internal Medicine

## 2020-05-03 ENCOUNTER — Encounter: Payer: Self-pay | Admitting: Internal Medicine

## 2020-05-03 ENCOUNTER — Other Ambulatory Visit: Payer: Self-pay

## 2020-05-03 VITALS — BP 116/74 | HR 82 | Ht 62.0 in | Wt 147.1 lb

## 2020-05-03 DIAGNOSIS — C73 Malignant neoplasm of thyroid gland: Secondary | ICD-10-CM | POA: Diagnosis not present

## 2020-05-03 DIAGNOSIS — E890 Postprocedural hypothyroidism: Secondary | ICD-10-CM

## 2020-05-03 LAB — TSH: TSH: 7.48 u[IU]/mL — ABNORMAL HIGH (ref 0.35–4.50)

## 2020-05-03 MED ORDER — LEVOTHYROXINE SODIUM 150 MCG PO TABS: 150.0000 ug | ORAL_TABLET | Freq: Every day | ORAL | 3 refills | Status: DC

## 2020-05-03 NOTE — Progress Notes (Signed)
Name: Donna Mueller  MRN/ DOB: [id number], 1990-07-13    Age/ Sex: 29 y.o., female     PCP: Fanny Bien, MD   Reason for Endocrinology Evaluation: Hx of PTC     Initial Endocrinology Clinic Visit: 09/16/2019    PATIENT IDENTIFIER: Donna Mueller is a 29 y.o., female with a past medical history of PTC. She has followed with [facility] since 09/16/2019 for consultative assistance with management of her Hx of PTC.   HISTORICAL SUMMARY:   Pt was diagnosed with thyroid papillary carcinoma in 2016. She is S/P total thyroidectomy in 07/2015, the tumor was 3.0 cm, 9/9 positive lymph nodes ( T2 N1a M0) . She is S/P RAI therapy for remnant ablation  10/2015.  She has an autotransplantation of the parathyroid gland to the left sternocleidomastoid mastoid with Dr. Harlow Asa  In 10/2016 Thyrogen stimulated WBS showed no evidence of iodine -avid metastatic thyroid cancer  08/30/2018 PET and WBS negative      She had seen Dr. Buddy Duty, Chalmers Cater  And Dr. Denton Lank in the past Paternal grandmother - Hypothyroidism  SUBJECTIVE:    Today (05/03/2020):  Donna Mueller is here for a f/u on post-operative hypothyroidism secondary to PTC. She is 27 Weeks of gestation   She has been gaining weight appropriately   No constipation , has occasional nausea  Has heartburn  No local neck symptoms    She is currently on Levothyroxine 112   EDD 06/06/2020  She is having a boy Donna Mueller  (06/03/2020)     HISTORY:  Past Medical History:  Past Medical History:  Diagnosis Date  . ADD (attention deficit disorder)   . Difficulty sleeping   . GERD (gastroesophageal reflux disease)   . Headache    HX MIGRAINES  . Knee pain    LEFT  . Papillary thyroid carcinoma [facility])    Past Surgical History:  Past Surgical History:  Procedure Laterality Date  . LYMPH NODE DISSECTION N/A 07/20/2015   Procedure: LYMPH NODE DISSECTION;  Surgeon: Armandina Gemma, MD;  [facility];  Service: General;   Laterality: N/A;  . THYROIDECTOMY N/A 07/20/2015   Procedure: TOTAL THYROIDECTOMY WITH LIMITED CENTRAL COMPARTMENT  LYMPH NODE DISSECTION  AND AUTO-TRANSPLANTATION OF LEFT INFERIOR PARATHYROID ;  Surgeon: Armandina Gemma, MD;  [facility];  Service: General;  Laterality: N/A;    Social History:  reports that she has never smoked. She has never used smokeless tobacco. She reports current alcohol use. She reports that she does not use drugs. Family History:  Family History  Problem Relation Age of Onset  . Glaucoma Mother   . Anxiety disorder Father   . Healthy Brother   . Thyroid disease Paternal Uncle   . Lung cancer Maternal Grandmother   . Diabetes Maternal Grandfather      HOME MEDICATIONS: Allergies as of 05/03/2020   No Known Allergies     Medication List       Accurate as of May 03, 2020  3:27 PM. If you have any questions, ask your nurse or doctor.        acetaminophen 500 MG tablet Commonly known as: TYLENOL Take 500 mg by mouth every 6 (six) hours as needed for mild pain.   buPROPion 150 MG 24 hr tablet Commonly known as: WELLBUTRIN XL Take 150 mg by mouth daily.   levothyroxine 112 MCG tablet Commonly known as: SYNTHROID Take 1 tablet (112 mcg total) by mouth daily.   PRE-NATAL PO  Take by mouth.   rizatriptan 5 MG tablet Commonly known as: MAXALT Take 5 mg by mouth as needed for migraine.   sertraline 25 MG tablet Commonly known as: ZOLOFT         OBJECTIVE:   PHYSICAL EXAM: VS: BP 116/74   Pulse 82   Ht $R'5\' 2"'iH$  (1.575 m)   Wt 147 lb 2 oz (66.7 kg)   LMP 08/31/2019   SpO2 98%   BMI 26.91 kg/m    EXAM: General: Pt appears well and is in NAD  Neck: General: Supple without adenopathy. Thyroid: Thyroid is surgically removed  Lungs: Clear with good BS bilat with no rales, rhonchi, or wheezes  Heart: Auscultation: RRR.  Abdomen: Normoactive bowel sounds, soft, nontender, without masses or organomegaly palpable  Extremities:  BL LE: No  pretibial edema normal ROM and strength.  Neuro: Cranial nerves: II - XII grossly intact  Motor: Normal strength throughout DTRs: 2+ and symmetric in UE without delay in relaxation phase  Mental Status: Judgment, insight: Intact Orientation: Oriented to time, place, and person  Mood and affect: No depression, anxiety, or agitation     DATA REVIEWED:  Results for Donna Mueller (MRN [id number]) as of 05/03/2020 17:29  Ref. Range 05/03/2020 15:47  TSH Latest Ref Range: 0.35 - 4.50 uIU/mL 7.48 (H)     ASSESSMENT / PLAN / RECOMMENDATIONS:   1. Hx of Papillary Thyroid Carcinoma : S/P total thyroidectomy 2017 (T2 N1a M0)   -Patient with excellent structural and biochemical response to treatment, her last whole-body scan was done in 08/2018 with no evidence of iodine avid cancer. -TSH goal 0.1-0.5 uIU/mL -  Tg and Tb Ab pending  - Ultrasound order 09/2019 has not been performed.   Medications :   Stop Levothyroxine 112 mcg Start Levothyroxine 150 mcg daily     2 . Postoperative Hypothyroidism, Pt in 3rd Trimester :  -Patient is clinically euthyroid  -No local neck symptoms -TSH remains elevated  Despite escalating the levothyroxine dose, will increase again as below     Medications Stop Levothyroxine 112 mcg  Start Levothyroxine 150 mcg daily       F/U in 3 months   Signed electronically by: Mack Guise, MD  [facility]  [facility] [address] [place], [place] [address] Phone: [phone] FAX: [phone]      CC: Fanny Bien, [address] [place] [address] Phone: [phone]  Fax: [phone]   Return to Endocrinology clinic as below: No future appointments.

## 2020-05-03 NOTE — Patient Instructions (Signed)
-   Continue Levothyroxine 112 mcg daily for now  

## 2020-05-04 LAB — THYROGLOBULIN LEVEL: Thyroglobulin: 0.2 ng/mL — ABNORMAL LOW

## 2020-05-04 LAB — THYROGLOBULIN ANTIBODY: Thyroglobulin Ab: 1 IU/mL (ref <=1)

## 2020-05-07 DIAGNOSIS — Z369 Encounter for antenatal screening, unspecified: Secondary | ICD-10-CM | POA: Diagnosis not present

## 2020-05-07 LAB — OB RESULTS CONSOLE GBS: GBS: POSITIVE

## 2020-05-20 ENCOUNTER — Telehealth (HOSPITAL_COMMUNITY): Payer: Self-pay | Admitting: *Deleted

## 2020-05-20 ENCOUNTER — Encounter (HOSPITAL_COMMUNITY): Payer: Self-pay | Admitting: *Deleted

## 2020-05-20 NOTE — Telephone Encounter (Signed)
Preadmission screen  

## 2020-05-21 ENCOUNTER — Telehealth (HOSPITAL_COMMUNITY): Payer: Self-pay | Admitting: *Deleted

## 2020-05-21 NOTE — Telephone Encounter (Signed)
Preadmission screen  

## 2020-05-24 ENCOUNTER — Telehealth (HOSPITAL_COMMUNITY): Payer: Self-pay | Admitting: *Deleted

## 2020-05-24 ENCOUNTER — Encounter (HOSPITAL_COMMUNITY): Payer: Self-pay | Admitting: *Deleted

## 2020-05-24 NOTE — Telephone Encounter (Signed)
Preadmission screen  

## 2020-05-27 ENCOUNTER — Inpatient Hospital Stay (HOSPITAL_COMMUNITY): Payer: BC Managed Care – PPO | Admitting: Anesthesiology

## 2020-05-27 ENCOUNTER — Encounter (HOSPITAL_COMMUNITY): Payer: Self-pay | Admitting: Obstetrics and Gynecology

## 2020-05-27 ENCOUNTER — Inpatient Hospital Stay (HOSPITAL_COMMUNITY)
Admission: AD | Admit: 2020-05-27 | Discharge: 2020-05-28 | DRG: 768 | Disposition: A | Discharge status: Home / Self Care | Payer: BC Managed Care – PPO | Attending: Obstetrics and Gynecology | Admitting: Obstetrics and Gynecology

## 2020-05-27 ENCOUNTER — Other Ambulatory Visit: Payer: Self-pay

## 2020-05-27 DIAGNOSIS — F419 Anxiety disorder, unspecified: Secondary | ICD-10-CM | POA: Diagnosis not present

## 2020-05-27 DIAGNOSIS — Z3A00 Weeks of gestation of pregnancy not specified: Secondary | ICD-10-CM | POA: Diagnosis not present

## 2020-05-27 DIAGNOSIS — Z3A38 38 weeks gestation of pregnancy: Secondary | ICD-10-CM | POA: Diagnosis not present

## 2020-05-27 DIAGNOSIS — Z20822 Contact with and (suspected) exposure to covid-19: Secondary | ICD-10-CM | POA: Diagnosis present

## 2020-05-27 DIAGNOSIS — O99284 Endocrine, nutritional and metabolic diseases complicating childbirth: Secondary | ICD-10-CM | POA: Diagnosis not present

## 2020-05-27 DIAGNOSIS — E890 Postprocedural hypothyroidism: Secondary | ICD-10-CM | POA: Diagnosis present

## 2020-05-27 DIAGNOSIS — Z412 Encounter for routine and ritual male circumcision: Secondary | ICD-10-CM | POA: Diagnosis not present

## 2020-05-27 DIAGNOSIS — O99824 Streptococcus B carrier state complicating childbirth: Secondary | ICD-10-CM | POA: Diagnosis not present

## 2020-05-27 DIAGNOSIS — Z3A39 39 weeks gestation of pregnancy: Secondary | ICD-10-CM | POA: Diagnosis not present

## 2020-05-27 DIAGNOSIS — E039 Hypothyroidism, unspecified: Secondary | ICD-10-CM | POA: Diagnosis not present

## 2020-05-27 DIAGNOSIS — Z3490 Encounter for supervision of normal pregnancy, unspecified, unspecified trimester: Secondary | ICD-10-CM

## 2020-05-27 DIAGNOSIS — O331 Maternal care for disproportion due to generally contracted pelvis: Secondary | ICD-10-CM | POA: Diagnosis not present

## 2020-05-27 DIAGNOSIS — O26893 Other specified pregnancy related conditions, third trimester: Secondary | ICD-10-CM | POA: Diagnosis not present

## 2020-05-27 DIAGNOSIS — F32A Depression, unspecified: Secondary | ICD-10-CM | POA: Diagnosis present

## 2020-05-27 DIAGNOSIS — O99344 Other mental disorders complicating childbirth: Secondary | ICD-10-CM | POA: Diagnosis not present

## 2020-05-27 DIAGNOSIS — Z8349 Family history of other endocrine, nutritional and metabolic diseases: Secondary | ICD-10-CM | POA: Diagnosis not present

## 2020-05-27 DIAGNOSIS — O411290 Chorioamnionitis, unspecified trimester, not applicable or unspecified: Secondary | ICD-10-CM | POA: Diagnosis not present

## 2020-05-27 DIAGNOSIS — Z2882 Immunization not carried out because of caregiver refusal: Secondary | ICD-10-CM | POA: Diagnosis not present

## 2020-05-27 DIAGNOSIS — O479 False labor, unspecified: Secondary | ICD-10-CM

## 2020-05-27 HISTORY — DX: Depression, unspecified: F32.A

## 2020-05-27 HISTORY — DX: Anxiety disorder, unspecified: F41.9

## 2020-05-27 LAB — TYPE AND SCREEN
ABO/RH(D): O POS
Antibody Screen: NEGATIVE

## 2020-05-27 LAB — RPR: RPR Ser Ql: NONREACTIVE

## 2020-05-27 LAB — RESPIRATORY PANEL BY RT PCR (FLU A&B, COVID)
Influenza A by PCR: NEGATIVE
Influenza B by PCR: NEGATIVE
SARS Coronavirus 2 by RT PCR: NEGATIVE

## 2020-05-27 LAB — CBC
HCT: 37.7 % (ref 36.0–46.0)
Hemoglobin: 12.5 g/dL (ref 12.0–15.0)
MCH: 30.9 pg (ref 26.0–34.0)
MCHC: 33.2 g/dL (ref 30.0–36.0)
MCV: 93.1 fL (ref 80.0–100.0)
Platelets: 172 10*3/uL (ref 150–400)
RBC: 4.05 MIL/uL (ref 3.87–5.11)
RDW: 12.9 % (ref 11.5–15.5)
WBC: 11.3 10*3/uL — ABNORMAL HIGH (ref 4.0–10.5)
nRBC: 0 % (ref 0.0–0.2)

## 2020-05-27 MED ORDER — OXYCODONE HCL 5 MG PO TABS: 10.0000 mg | ORAL_TABLET | ORAL | Status: DC | PRN

## 2020-05-27 MED ORDER — SODIUM CHLORIDE (PF) 0.9 % IJ SOLN
INTRAMUSCULAR | Status: DC | PRN
  Administered 2020-05-27: 12 mL/h via EPIDURAL

## 2020-05-27 MED ORDER — ONDANSETRON HCL 4 MG PO TABS: 4.0000 mg | ORAL_TABLET | ORAL | Status: DC | PRN

## 2020-05-27 MED ORDER — LACTATED RINGERS IV SOLN: 500.0000 mL | INTRAVENOUS | Status: DC | PRN

## 2020-05-27 MED ORDER — WITCH HAZEL-GLYCERIN EX PADS: 1.0000 "application " | MEDICATED_PAD | CUTANEOUS | Status: DC | PRN

## 2020-05-27 MED ORDER — ZOLPIDEM TARTRATE 5 MG PO TABS: 5.0000 mg | ORAL_TABLET | Freq: Every evening | ORAL | Status: DC | PRN

## 2020-05-27 MED ORDER — DIPHENHYDRAMINE HCL 25 MG PO CAPS: 25.0000 mg | ORAL_CAPSULE | Freq: Four times a day (QID) | ORAL | Status: DC | PRN

## 2020-05-27 MED ORDER — OXYTOCIN-SODIUM CHLORIDE 30-0.9 UT/500ML-% IV SOLN
2.5000 [IU]/h | INTRAVENOUS | Status: DC
  Administered 2020-05-27: 2.5 [IU]/h via INTRAVENOUS
  Filled 2020-05-27: qty 500

## 2020-05-27 MED ORDER — SERTRALINE HCL 25 MG PO TABS
25.0000 mg | ORAL_TABLET | Freq: Every day | ORAL | Status: DC
  Administered 2020-05-27 – 2020-05-28 (×2): 25 mg via ORAL
  Filled 2020-05-27 (×4): qty 1

## 2020-05-27 MED ORDER — IBUPROFEN 600 MG PO TABS
600.0000 mg | ORAL_TABLET | Freq: Four times a day (QID) | ORAL | Status: DC
  Administered 2020-05-27 – 2020-05-28 (×4): 600 mg via ORAL
  Filled 2020-05-27 (×4): qty 1

## 2020-05-27 MED ORDER — OXYCODONE-ACETAMINOPHEN 5-325 MG PO TABS: 1.0000 | ORAL_TABLET | ORAL | Status: DC | PRN

## 2020-05-27 MED ORDER — ACETAMINOPHEN 325 MG PO TABS: 650.0000 mg | ORAL_TABLET | ORAL | Status: DC | PRN

## 2020-05-27 MED ORDER — SODIUM CHLORIDE 0.9 % IV SOLN
5.0000 10*6.[IU] | Freq: Once | INTRAVENOUS | Status: AC
  Administered 2020-05-27: 5 10*6.[IU] via INTRAVENOUS
  Filled 2020-05-27: qty 5

## 2020-05-27 MED ORDER — TERBUTALINE SULFATE 1 MG/ML IJ SOLN
INTRAMUSCULAR | Status: AC
  Filled 2020-05-27: qty 1

## 2020-05-27 MED ORDER — EPHEDRINE 5 MG/ML INJ: 10.0000 mg | INTRAVENOUS | Status: DC | PRN

## 2020-05-27 MED ORDER — BUTORPHANOL TARTRATE 1 MG/ML IJ SOLN: 1.0000 mg | INTRAMUSCULAR | Status: DC | PRN

## 2020-05-27 MED ORDER — TERBUTALINE SULFATE 1 MG/ML IJ SOLN: 0.2500 mg | Freq: Once | INTRAMUSCULAR | Status: DC | PRN

## 2020-05-27 MED ORDER — SENNOSIDES-DOCUSATE SODIUM 8.6-50 MG PO TABS
2.0000 | ORAL_TABLET | ORAL | Status: DC
  Administered 2020-05-28: 2 via ORAL
  Filled 2020-05-27: qty 2

## 2020-05-27 MED ORDER — LIDOCAINE HCL (PF) 1 % IJ SOLN: 30.0000 mL | INTRAMUSCULAR | Status: DC | PRN

## 2020-05-27 MED ORDER — DIPHENHYDRAMINE HCL 50 MG/ML IJ SOLN: 12.5000 mg | INTRAMUSCULAR | Status: DC | PRN

## 2020-05-27 MED ORDER — LACTATED RINGERS IV SOLN: 500.0000 mL | Freq: Once | INTRAVENOUS | Status: DC

## 2020-05-27 MED ORDER — OXYTOCIN BOLUS FROM INFUSION
333.0000 mL | Freq: Once | INTRAVENOUS | Status: AC
  Administered 2020-05-27: 333 mL via INTRAVENOUS

## 2020-05-27 MED ORDER — PHENYLEPHRINE 40 MCG/ML (10ML) SYRINGE FOR IV PUSH (FOR BLOOD PRESSURE SUPPORT)
80.0000 ug | PREFILLED_SYRINGE | INTRAVENOUS | Status: AC | PRN
  Administered 2020-05-27 (×3): 80 ug via INTRAVENOUS

## 2020-05-27 MED ORDER — OXYTOCIN-SODIUM CHLORIDE 30-0.9 UT/500ML-% IV SOLN: 1.0000 m[IU]/min | INTRAVENOUS | Status: DC

## 2020-05-27 MED ORDER — LIDOCAINE HCL (PF) 1 % IJ SOLN
INTRAMUSCULAR | Status: DC | PRN
  Administered 2020-05-27: 2 mL via EPIDURAL
  Administered 2020-05-27: 3 mL via EPIDURAL
  Administered 2020-05-27: 5 mL via EPIDURAL

## 2020-05-27 MED ORDER — PHENYLEPHRINE 40 MCG/ML (10ML) SYRINGE FOR IV PUSH (FOR BLOOD PRESSURE SUPPORT): 80.0000 ug | PREFILLED_SYRINGE | INTRAVENOUS | Status: DC | PRN

## 2020-05-27 MED ORDER — OXYCODONE HCL 5 MG PO TABS: 5.0000 mg | ORAL_TABLET | ORAL | Status: DC | PRN

## 2020-05-27 MED ORDER — ONDANSETRON HCL 4 MG/2ML IJ SOLN: 4.0000 mg | INTRAMUSCULAR | Status: DC | PRN

## 2020-05-27 MED ORDER — LEVOTHYROXINE SODIUM 75 MCG PO TABS
150.0000 ug | ORAL_TABLET | Freq: Every day | ORAL | Status: DC
  Administered 2020-05-27: 150 ug via ORAL
  Filled 2020-05-27 (×2): qty 1

## 2020-05-27 MED ORDER — BENZOCAINE-MENTHOL 20-0.5 % EX AERO
1.0000 "application " | INHALATION_SPRAY | CUTANEOUS | Status: DC | PRN
  Administered 2020-05-27 – 2020-05-28 (×2): 1 via TOPICAL
  Filled 2020-05-27 (×2): qty 56

## 2020-05-27 MED ORDER — LACTATED RINGERS IV SOLN: INTRAVENOUS | Status: DC

## 2020-05-27 MED ORDER — SIMETHICONE 80 MG PO CHEW: 80.0000 mg | CHEWABLE_TABLET | ORAL | Status: DC | PRN

## 2020-05-27 MED ORDER — TETANUS-DIPHTH-ACELL PERTUSSIS 5-2.5-18.5 LF-MCG/0.5 IM SUSY: 0.5000 mL | PREFILLED_SYRINGE | Freq: Once | INTRAMUSCULAR | Status: DC

## 2020-05-27 MED ORDER — ACETAMINOPHEN 325 MG PO TABS
650.0000 mg | ORAL_TABLET | ORAL | Status: DC | PRN
  Administered 2020-05-27: 650 mg via ORAL
  Filled 2020-05-27: qty 2

## 2020-05-27 MED ORDER — PENICILLIN G POT IN DEXTROSE 60000 UNIT/ML IV SOLN
3.0000 10*6.[IU] | INTRAVENOUS | Status: DC
  Administered 2020-05-27: 3 10*6.[IU] via INTRAVENOUS
  Filled 2020-05-27: qty 50

## 2020-05-27 MED ORDER — DIBUCAINE (PERIANAL) 1 % EX OINT: 1.0000 "application " | TOPICAL_OINTMENT | CUTANEOUS | Status: DC | PRN

## 2020-05-27 MED ORDER — FENTANYL-BUPIVACAINE-NACL 0.5-0.125-0.9 MG/250ML-% EP SOLN
12.0000 mL/h | EPIDURAL | Status: DC | PRN
  Filled 2020-05-27: qty 250

## 2020-05-27 MED ORDER — PHENYLEPHRINE 40 MCG/ML (10ML) SYRINGE FOR IV PUSH (FOR BLOOD PRESSURE SUPPORT)
80.0000 ug | PREFILLED_SYRINGE | INTRAVENOUS | Status: DC | PRN
  Administered 2020-05-27: 80 ug via INTRAVENOUS
  Filled 2020-05-27: qty 10

## 2020-05-27 MED ORDER — SOD CITRATE-CITRIC ACID 500-334 MG/5ML PO SOLN
30.0000 mL | ORAL | Status: DC | PRN
  Filled 2020-05-27: qty 15

## 2020-05-27 MED ORDER — PRENATAL MULTIVITAMIN CH
1.0000 | ORAL_TABLET | Freq: Every day | ORAL | Status: DC
  Administered 2020-05-28: 1 via ORAL
  Filled 2020-05-27: qty 1

## 2020-05-27 MED ORDER — BUPROPION HCL ER (XL) 150 MG PO TB24
150.0000 mg | ORAL_TABLET | Freq: Every day | ORAL | Status: DC
  Administered 2020-05-27 – 2020-05-28 (×2): 150 mg via ORAL
  Filled 2020-05-27 (×2): qty 1

## 2020-05-27 MED ORDER — ONDANSETRON HCL 4 MG/2ML IJ SOLN: 4.0000 mg | Freq: Four times a day (QID) | INTRAMUSCULAR | Status: DC | PRN

## 2020-05-27 MED ORDER — OXYCODONE-ACETAMINOPHEN 5-325 MG PO TABS: 2.0000 | ORAL_TABLET | ORAL | Status: DC | PRN

## 2020-05-27 MED ORDER — COCONUT OIL OIL: 1.0000 "application " | TOPICAL_OIL | Status: DC | PRN

## 2020-05-27 MED ORDER — HYDROXYZINE HCL 50 MG PO TABS: 50.0000 mg | ORAL_TABLET | Freq: Four times a day (QID) | ORAL | Status: DC | PRN

## 2020-05-27 NOTE — Progress Notes (Signed)
At bedside for prolonged deceleration.  S/p epidural. Phenylephrine given with improvement in BP and FHT. Cervix 5/80/-1.  Has not been on pit.  FHT now with moderate variability, no further decels. Continue current mgmt.  Alpha Gula MD

## 2020-05-27 NOTE — Progress Notes (Signed)
FHT cat one Cx 5/90/0 to -1 AROM clear

## 2020-05-27 NOTE — MAU Note (Signed)
Ctxs yesterday but stronger for last 3hrs. Denies VB. Some d/c coming out but unsure if is water. 1cm last sve

## 2020-05-27 NOTE — Progress Notes (Signed)
Operative Delivery Note At 3:40 PM a viable female was delivered via Vaginal, Vacuum Neurosurgeon).  Presentation: vertex; Position: Right,, Occiput,, Posterior; Station: +3.  Verbal consent: obtained from patient.  Risks and benefits discussed in detail.  Risks include, but are not limited to the risks of anesthesia, bleeding, infection, damage to maternal tissues, fetal cephalhematoma.  There is also the risk of inability to effect vaginal delivery of the head, or shoulder dystocia that cannot be resolved by established maneuvers, leading to the need for emergency cesarean section. Pediatric team present at delivery.  Pushing with good effort. Repeated decelerations to 80s with pushing. ROP and asynclytic. Recommend VE to shorten second stage in view of decelerations. Mushroom VE with one pop off. Switched to Cisco without pop off. Second degree MLE with small fourth degree extension-repaired in layers. Rectum checked>intact. APGAR: , ; weight  .   Placenta status:intact ,to pathology .   Cord: 3 vessels with the following complications: .  Cord pH: pending  Anesthesia:   Instruments: mushroom and Kiwi VE Episiotomy: Midline second degree with small fourth degree extension Lacerations:   Suture Repair: 2.0 vicryl rapide Est. Blood Loss (mL):  300  Mom to postpartum.  Baby to Couplet care / Skin to Skin.  Shon Millet II 05/27/2020, 4:07 PM

## 2020-05-27 NOTE — Progress Notes (Signed)
9 (mostly on left)/C/+1 FHT cat one

## 2020-05-27 NOTE — Anesthesia Preprocedure Evaluation (Signed)
Anesthesia Evaluation  Patient identified by MRN, date of birth, ID band Patient awake    Reviewed: Allergy & Precautions, Patient's Chart, lab work & pertinent test results  Airway Mallampati: II       Dental   Pulmonary neg pulmonary ROS,    Pulmonary exam normal        Cardiovascular negative cardio ROS Normal cardiovascular exam     Neuro/Psych    GI/Hepatic Neg liver ROS, GERD  ,  Endo/Other  Hypothyroidism   Renal/GU negative Renal ROS     Musculoskeletal   Abdominal   Peds  Hematology negative hematology ROS (+)   Anesthesia Other Findings   Reproductive/Obstetrics (+) Pregnancy                             Anesthesia Physical Anesthesia Plan  ASA: II  Anesthesia Plan: Epidural   Post-op Pain Management:    Induction:   PONV Risk Score and Plan: Treatment may vary due to age or medical condition  Airway Management Planned: Natural Airway  Additional Equipment:   Intra-op Plan:   Post-operative Plan:   Informed Consent: I have reviewed the patients History and Physical, chart, labs and discussed the procedure including the risks, benefits and alternatives for the proposed anesthesia with the patient or authorized representative who has indicated his/her understanding and acceptance.       Plan Discussed with: CRNA  Anesthesia Plan Comments:         Anesthesia Quick Evaluation

## 2020-05-27 NOTE — H&P (Signed)
OB History and Physical   Donna Mueller is a 29 y.o. female G1P0 presenting for contractions at [redacted]w[redacted]d.  She was found to be in labor and was admitted for L&D.  Cervix previously 1 cm in the office.  Now 4/70/-2 with change to 5/80/-1 in MAU.  Pt is s/p total thyroidectomy and follows with [facility].    OB History    Gravida  1   Para      Term      Preterm      AB      Living        SAB      TAB      Ectopic      Multiple      Live Births             Past Medical History:  Diagnosis Date  . ADD (attention deficit disorder)   . Complication of anesthesia   . Difficulty sleeping   . GERD (gastroesophageal reflux disease)   . Headache    HX MIGRAINES  . Knee pain    LEFT  . Papillary thyroid carcinoma ([place])   . PONV (postoperative nausea and vomiting)    Past Surgical History:  Procedure Laterality Date  . LYMPH NODE DISSECTION N/A 07/20/2015   Procedure: LYMPH NODE DISSECTION;  Surgeon: Armandina Gemma, MD;  [facility];  Service: General;  Laterality: N/A;  . THYROIDECTOMY N/A 07/20/2015   Procedure: TOTAL THYROIDECTOMY WITH LIMITED CENTRAL COMPARTMENT  LYMPH NODE DISSECTION  AND AUTO-TRANSPLANTATION OF LEFT INFERIOR PARATHYROID ;  Surgeon: Armandina Gemma, MD;  Location: [facility];  Service: General;  Laterality: N/A;   Family History: family history includes Anxiety disorder in her father; Diabetes in her maternal grandfather; Glaucoma in her mother; Healthy in her brother; Lung cancer in her maternal grandmother; Thyroid disease in her paternal uncle. Social History:  reports that she has never smoked. She has never used smokeless tobacco. She reports current alcohol use. She reports that she does not use drugs.     Maternal Diabetes: No Genetic Screening: n/a  Maternal Ultrasounds/Referrals: Normal Fetal Ultrasounds or other Referrals:  None Maternal Substance Abuse:  No Significant Maternal Medications:  levothyroxine Significant Maternal  Lab Results:  Group B Strep positive Other Comments:  None  Review of Systems History Dilation: 4 Effacement (%): 70 Station: -1 Exam by:: Wilhemena Durie RN Blood pressure 122/84, pulse 81, temperature 98.1 F (36.7 C), resp. rate 18, height 5\' 2"  (1.575 m), weight 67.6 kg, last menstrual period 08/31/2019. Exam Physical Exam  Prenatal labs: ABO, Rh: O/Positive/-- (04/22 0000) Antibody: Negative (04/22 0000) Rubella: Immune (04/22 0000) RPR: Nonreactive (04/22 0000)  HBsAg: Negative (04/22 0000)  HIV: Non-reactive (04/22 0000)  GBS: Positive/-- (10/29 0000)   Assessment/Plan: . Admit to Labor and Delivery . PCN for GBS + . AROM when able, will await more PCN time . Epidural when desired   Carlyon Shadow 05/27/2020, 4:31 AM

## 2020-05-27 NOTE — ED Provider Notes (Signed)
[facility] EMERGENCY DEPARTMENT Provider Note   CSN: [id number] Arrival date & time: 05/27/20  5573     History No chief complaint on file.   Donna Mueller is a 29 y.o. female.  Patient presents in labor.  She is [redacted] weeks pregnant with her first baby.  Patient experiencing contractions every 3 to 4 minutes.  No vaginal bleeding.        Past Medical History:  Diagnosis Date  . ADD (attention deficit disorder)   . Complication of anesthesia   . Difficulty sleeping   . GERD (gastroesophageal reflux disease)   . Headache    HX MIGRAINES  . Knee pain    LEFT  . Papillary thyroid carcinoma ([place])   . PONV (postoperative nausea and vomiting)     Patient Active Problem List   Diagnosis Date Noted  . Postoperative hypothyroidism 09/16/2019  . Sepsis, Gram negative ([place]) 09/18/2018  . Vulvar cellulitis 09/18/2018  . Herpes labialis 09/18/2018  . Oral contraceptive use 07/12/2017  . Post-surgical hypothyroidism 07/12/2017  . Papillary thyroid carcinoma ([place]) 07/19/2015    Past Surgical History:  Procedure Laterality Date  . LYMPH NODE DISSECTION N/A 07/20/2015   Procedure: LYMPH NODE DISSECTION;  Surgeon: Armandina Gemma, MD;  Location: [facility];  Service: General;  Laterality: N/A;  . THYROIDECTOMY N/A 07/20/2015   Procedure: TOTAL THYROIDECTOMY WITH LIMITED CENTRAL COMPARTMENT  LYMPH NODE DISSECTION  AND AUTO-TRANSPLANTATION OF LEFT INFERIOR PARATHYROID ;  Surgeon: Armandina Gemma, MD;  Location: [facility];  Service: General;  Laterality: N/A;     OB History    Gravida  1   Para      Term      Preterm      AB      Living        SAB      TAB      Ectopic      Multiple      Live Births              Family History  Problem Relation Age of Onset  . Glaucoma Mother   . Anxiety disorder Father   . Healthy Brother   . Thyroid disease Paternal Uncle   . Lung cancer Maternal Grandmother   . Diabetes Maternal Grandfather     Social  History   Tobacco Use  . Smoking status: Never Smoker  . Smokeless tobacco: Never Used  Vaping Use  . Vaping Use: Never used  Substance Use Topics  . Alcohol use: Yes    Comment: OCCASIONAL  . Drug use: No    Home Medications Prior to Admission medications   Medication Sig Start Date End Date Taking? Authorizing Provider  acetaminophen (TYLENOL) 500 MG tablet Take 500 mg by mouth every 6 (six) hours as needed for mild pain.     [provider]  buPROPion (WELLBUTRIN XL) 150 MG 24 hr tablet Take 150 mg by mouth daily. 07/22/18   [provider]  levothyroxine (SYNTHROID) 150 MCG tablet Take 1 tablet (150 mcg total) by mouth daily. 05/03/20   Shamleffer, Melanie Crazier, MD  Prenatal Multivit-Min-Fe-FA (PRE-NATAL PO) Take by mouth.    [provider]  rizatriptan (MAXALT) 5 MG tablet Take 5 mg by mouth as needed for migraine.  07/23/18   [provider]  sertraline (ZOLOFT) 25 MG tablet  05/12/19   [provider]    Allergies    Patient has no known allergies.  Review of  Systems   Review of Systems  Genitourinary: Negative for vaginal bleeding.  All other systems reviewed and are negative.   Physical Exam Updated Vital Signs LMP 08/31/2019   Physical Exam Vitals and nursing note reviewed.  Constitutional:      General: She is not in acute distress.    Appearance: Normal appearance. She is well-developed.  HENT:     Head: Normocephalic and atraumatic.     Right Ear: Hearing normal.     Left Ear: Hearing normal.     Nose: Nose normal.  Eyes:     Conjunctiva/sclera: Conjunctivae normal.     Pupils: Pupils are equal, round, and reactive to light.  Cardiovascular:     Rate and Rhythm: Regular rhythm.     Heart sounds: S1 normal and S2 normal. No murmur heard.  No friction rub. No gallop.   Pulmonary:     Effort: Pulmonary effort is normal. No respiratory distress.     Breath sounds: Normal breath sounds.  Chest:     Chest  wall: No tenderness.  Abdominal:     General: Bowel sounds are normal.     Palpations: Abdomen is soft.     Tenderness: There is no abdominal tenderness. There is no guarding or rebound. Negative signs include Murphy's sign and McBurney's sign.     Hernia: No hernia is present.  Musculoskeletal:        General: Normal range of motion.     Cervical back: Normal range of motion and neck supple.  Skin:    General: Skin is warm and dry.     Findings: No rash.  Neurological:     Mental Status: She is alert and oriented to person, place, and time.     GCS: GCS eye subscore is 4. GCS verbal subscore is 5. GCS motor subscore is 6.     Cranial Nerves: No cranial nerve deficit.     Sensory: No sensory deficit.     Coordination: Coordination normal.  Psychiatric:        Speech: Speech normal.        Behavior: Behavior normal.        Thought Content: Thought content normal.     ED Results / Procedures / Treatments   Labs (all labs ordered are listed, but only abnormal results are displayed) Labs Reviewed - No data to display  EKG None  Radiology No results found.  Procedures Procedures (including critical care time)  Medications Ordered in ED Medications - No data to display  ED Course  I have reviewed the triage vital signs and the nursing notes.  Pertinent labs & imaging results that were available during my care of the patient were reviewed by me and considered in my medical decision making (see chart for details).    MDM Rules/Calculators/A&P                          Patient arrives in active labor at [redacted] weeks gestation.  Patient in no distress.  She is stable for transfer to MAU.  Discussed briefly with Dr. Elgie Congo, accepts patient for transfer to OB/GYN.  Final Clinical Impression(s) / ED Diagnoses Final diagnoses:  Uterine contractions    Rx / DC Orders ED Discharge Orders    None       Shellie Rogoff, Gwenyth Allegra, MD 05/27/20 [phone]

## 2020-05-27 NOTE — ED Triage Notes (Signed)
39 week, g1 p0, contractions, small amount of fluid leaking. Seen by dr Betsey Holiday, transported to MAU via w/c

## 2020-05-27 NOTE — Anesthesia Procedure Notes (Signed)
Epidural Patient location during procedure: OB Start time: 05/27/2020 5:20 AM End time: 05/27/2020 5:27 AM  Staffing Anesthesiologist: Suzette Battiest, MD Performed: anesthesiologist   Preanesthetic Checklist Completed: patient identified, IV checked, site marked, risks and benefits discussed, surgical consent, monitors and equipment checked, pre-op evaluation and timeout performed  Epidural Patient position: sitting Prep: DuraPrep and site prepped and draped Patient monitoring: continuous pulse ox and blood pressure Approach: midline Location: L4-L5 Injection technique: LOR air  Needle:  Needle type: Tuohy  Needle gauge: 17 G Needle length: 9 cm and 9 Needle insertion depth: 6 cm Catheter type: closed end flexible Catheter size: 19 Gauge Catheter at skin depth: 11 cm Test dose: negative  Assessment Events: blood not aspirated, injection not painful, no injection resistance, no paresthesia and negative IV test

## 2020-05-28 LAB — CBC
HCT: 29.9 % — ABNORMAL LOW (ref 36.0–46.0)
Hemoglobin: 9.6 g/dL — ABNORMAL LOW (ref 12.0–15.0)
MCH: 30.8 pg (ref 26.0–34.0)
MCHC: 32.1 g/dL (ref 30.0–36.0)
MCV: 95.8 fL (ref 80.0–100.0)
Platelets: 116 10*3/uL — ABNORMAL LOW (ref 150–400)
RBC: 3.12 MIL/uL — ABNORMAL LOW (ref 3.87–5.11)
RDW: 13.3 % (ref 11.5–15.5)
WBC: 12.2 10*3/uL — ABNORMAL HIGH (ref 4.0–10.5)
nRBC: 0 % (ref 0.0–0.2)

## 2020-05-28 NOTE — Anesthesia Postprocedure Evaluation (Signed)
Anesthesia Post Note  Patient: DEZARIA METHOT  Procedure(s) Performed: AN AD HOC LABOR EPIDURAL     Patient location during evaluation: Mother Baby Anesthesia Type: Epidural Level of consciousness: awake and alert Pain management: pain level controlled Vital Signs Assessment: post-procedure vital signs reviewed and stable Respiratory status: spontaneous breathing, nonlabored ventilation and respiratory function stable Cardiovascular status: stable Postop Assessment: no headache, no backache and epidural receding Anesthetic complications: no   No complications documented.  Last Vitals:  Vitals:   05/27/20 2144 05/28/20 0312  BP: 114/75 108/70  Pulse: 75 68  Resp: 16 16  Temp: (!) 36.4 C 36.5 C  SpO2: 99% 98%    Last Pain:  Vitals:   05/28/20 0312  TempSrc: Oral  PainSc:    Pain Goal: Patients Stated Pain Goal: 2 (05/27/20 2144)                 Rayvon Char

## 2020-05-28 NOTE — Progress Notes (Signed)
MOB was referred for history of depression/anxiety. * Referral screened out by Clinical Social Worker because none of the following criteria appear to apply: ~ History of anxiety/depression during this pregnancy, or of post-partum depression following prior delivery. ~ Diagnosis of anxiety and/or depression within last 3 years OR * MOB's symptoms currently being treated with medication and/or therapy. Per further chart review, MOB on Zoloft and Wellbutrin for anxiety,depression, and panic attacks.     Please contact the Clinical Social Worker if needs arise, by [facility] request, or if MOB scores greater than 9/yes to question 10 on Edinburgh Postpartum Depression Screen.    Donna Mueller, MSW, LCSW [facility] [place] [phone]

## 2020-05-28 NOTE — Discharge Summary (Signed)
Postpartum Discharge Summary  Date of Service updated 05/28/20     Patient Name: Donna Mueller DOB: [birth year] MRN: [id number]  Date of admission: 05/27/2020 Delivery date:05/27/2020  Delivering provider: Everlene Farrier  Date of discharge: 05/28/2020  Admitting diagnosis: Pregnancy [Z34.90] Intrauterine pregnancy: [redacted]w[redacted]d    Secondary diagnosis:  Active Problems:   Pregnancy  Additional problems: fourth degree tear    Discharge diagnosis: Term Pregnancy Delivered                                              Post partum procedures:none Augmentation: AROM Complications: None  Hospital course: Onset of Labor With Vaginal Delivery      29y.o. yo G1P1001 at 321w4das admitted in Latent Labor on 05/27/2020. Patient had an uncomplicated labor course as follows:  Membrane Rupture Time/Date: 7:55 AM ,05/27/2020   Delivery Method:Vaginal, Vacuum (Extractor)  s/s nrFHT Episiotomy: Median  Lacerations:    Patient had an uncomplicated postpartum course.  She is ambulating, tolerating a regular diet, passing flatus, and urinating well. Patient is discharged home in stable condition on 05/28/20.  Newborn Data: Birth date:05/27/2020  Birth time:3:40 PM  Gender:Female  Living status:Living  Apgars:9 ,9  [phone]   Magnesium Sulfate received: No BMZ received: No Rhophylac:N/A MMR:N/A T-DaP:Given prenatally Flu: N/A Transfusion:No  Physical exam  Vitals:   05/27/20 1835 05/27/20 2144 05/28/20 0312 05/28/20 0755  BP: 108/71 114/75 108/70 108/75  Pulse: 89 75 68 79  Resp: _0 Temp: 98.6 F (37 C) (!) 97.5 F (36.4 C) 97.7 F (36.5 C) 97.6 F (36.4 C)  TempSrc: Oral Oral Oral Oral  SpO2: 99% 99% 98% 100%  Weight:      Height:       General: alert Lochia: appropriate Uterine Fundus: firm Incision: N/A DVT Evaluation: No evidence of DVT seen on physical exam. Labs: Lab Results  Component Value Date   WBC 11.3 (H) 05/27/2020   HGB 12.5 05/27/2020    HCT 37.7 05/27/2020   MCV 93.1 05/27/2020   PLT 172 05/27/2020   CMP Latest Ref Rng & Units 09/23/2018  Glucose 70 - 99 mg/dL 94  BUN 6 - 20 mg/dL <5(L)  Creatinine 0.44 - 1.00 mg/dL 0.50  Sodium 135 - 145 mmol/L 137  Potassium 3.5 - 5.1 mmol/L 3.2(L)  Chloride 98 - 111 mmol/L 107  CO2 22 - 32 mmol/L 25  Calcium 8.9 - 10.3 mg/dL 7.9(L)  Total Protein 6.5 - 8.1 g/dL -  Total Bilirubin 0.3 - 1.2 mg/dL -  Alkaline Phos 38 - 126 U/L -  AST 15 - 41 U/L -  ALT 0 - 44 U/L -   Edinburgh Score: Edinburgh Postnatal Depression Scale Screening Tool 05/28/2020  I have been able to laugh and see the funny side of things. (No Data)      After visit meds:  Allergies as of 05/28/2020   No Known Allergies     Medication List    STOP taking these medications   buPROPion 150 MG 24 hr tablet Commonly known as: WELLBUTRIN XL   rizatriptan 5 MG tablet Commonly known as: MAXALT     TAKE these medications   acetaminophen 500 MG tablet Commonly known as: TYLENOL Take 500 mg by mouth every 6 (six) hours as needed for mild pain.   levothyroxine 150  MCG tablet Commonly known as: SYNTHROID Take 1 tablet (150 mcg total) by mouth daily.   PRE-NATAL PO Take by mouth.   sertraline 25 MG tablet Commonly known as: ZOLOFT      D/W patient female infant circumcision, risks/benefits reviewed. All questions answered.   Discharge home in stable condition Infant Feeding: Bottle Infant Disposition:home with mother Discharge instruction: per After Visit Summary and Postpartum booklet. Activity: Advance as tolerated. Pelvic rest for 6 weeks.  Diet: routine diet Anticipated Birth Control: Unsure Postpartum Appointment:1 week Additional Postpartum F/U: to check tear is healing well Future Appointments: Future Appointments  Date Time Provider [place]  05/29/2020 10:15 AM [facility]-SCREENING [facility] None  07/29/2020 10:10 AM Shamleffer, Melanie Crazier, MD [facility] None   Follow up  Visit:      05/28/2020 Tyson Dense, MD

## 2020-05-29 ENCOUNTER — Other Ambulatory Visit (HOSPITAL_COMMUNITY): Admission: RE | Admit: 2020-05-29 | Payer: BC Managed Care – PPO | Source: Ambulatory Visit

## 2020-05-31 ENCOUNTER — Inpatient Hospital Stay (HOSPITAL_COMMUNITY): Payer: BC Managed Care – PPO

## 2020-05-31 ENCOUNTER — Inpatient Hospital Stay (HOSPITAL_COMMUNITY)
Admission: AD | Admit: 2020-05-31 | Payer: BC Managed Care – PPO | Source: Home / Self Care | Admitting: Obstetrics and Gynecology

## 2020-05-31 LAB — SURGICAL PATHOLOGY

## 2020-06-06 ENCOUNTER — Inpatient Hospital Stay (HOSPITAL_COMMUNITY): Admit: 2020-06-06 | Payer: Self-pay

## 2020-07-14 DIAGNOSIS — Z1389 Encounter for screening for other disorder: Secondary | ICD-10-CM | POA: Diagnosis not present

## 2020-07-29 ENCOUNTER — Ambulatory Visit: Payer: BC Managed Care – PPO | Admitting: Internal Medicine

## 2020-07-29 NOTE — Progress Notes (Deleted)
Name: Donna Mueller  MRN/ DOB: [id number], 1991/06/13    Age/ Sex: 30 y.o., female     PCP: Fanny Bien, MD   Reason for Endocrinology Evaluation: Hx of PTC     Initial Endocrinology Clinic Visit: 09/16/2019    PATIENT IDENTIFIER: Donna Mueller is a 30 y.o., female with a past medical history of PTC. She has followed with [facility] since 09/16/2019 for consultative assistance with management of her Hx of PTC.   HISTORICAL SUMMARY:   Pt was diagnosed with thyroid papillary carcinoma in 2016. She is S/P total thyroidectomy in 07/2015, the tumor was 3.0 cm, 9/9 positive lymph nodes ( T2 N1a M0) . She is S/P RAI therapy for remnant ablation  10/2015.  She has an autotransplantation of the parathyroid gland to the left sternocleidomastoid mastoid with Dr. Harlow Asa  In 10/2016 Thyrogen stimulated WBS showed no evidence of iodine -avid metastatic thyroid cancer  08/30/2018 PET and WBS negative   She is S/P NVD 05/27/2020 ( [place])   She had seen Dr. Buddy Duty, Chalmers Cater  And Dr. Denton Lank in the past Paternal grandmother - Hypothyroidism  SUBJECTIVE:    Today (07/29/2020):  Donna Mueller is here for a f/u on post-operative hypothyroidism secondary to PTC.   She has been gaining weight appropriately   No constipation , has occasional nausea  Has heartburn  No local neck symptoms    She is currently on Levothyroxine 150 mcg        HISTORY:  Past Medical History:  Past Medical History:  Diagnosis Date  . ADD (attention deficit disorder)   . Anxiety   . Complication of anesthesia   . Depression   . Difficulty sleeping   . GERD (gastroesophageal reflux disease)   . Headache    HX MIGRAINES  . Knee pain    LEFT  . Papillary thyroid carcinoma ([place])   . PONV (postoperative nausea and vomiting)    Past Surgical History:  Past Surgical History:  Procedure Laterality Date  . LYMPH NODE DISSECTION N/A 07/20/2015   Procedure: LYMPH NODE DISSECTION;  Surgeon:  Armandina Gemma, MD;  Location: [facility];  Service: General;  Laterality: N/A;  . THYROIDECTOMY N/A 07/20/2015   Procedure: TOTAL THYROIDECTOMY WITH LIMITED CENTRAL COMPARTMENT  LYMPH NODE DISSECTION  AND AUTO-TRANSPLANTATION OF LEFT INFERIOR PARATHYROID ;  Surgeon: Armandina Gemma, MD;  Location: [facility];  Service: General;  Laterality: N/A;    Social History:  reports that she has never smoked. She has never used smokeless tobacco. She reports previous alcohol use. She reports that she does not use drugs. Family History:  Family History  Problem Relation Age of Onset  . Glaucoma Mother   . Anxiety disorder Father   . Healthy Brother   . Thyroid disease Paternal Uncle   . Lung cancer Maternal Grandmother   . Diabetes Maternal Grandfather      HOME MEDICATIONS: Allergies as of 07/29/2020   No Known Allergies     Medication List       Accurate as of July 29, 2020  7:28 AM. If you have any questions, ask your nurse or doctor.        acetaminophen 500 MG tablet Commonly known as: TYLENOL Take 500 mg by mouth every 6 (six) hours as needed for mild pain.   levothyroxine 150 MCG tablet Commonly known as: SYNTHROID Take 1 tablet (150 mcg total) by mouth daily.   PRE-NATAL PO Take by mouth.  sertraline 25 MG tablet Commonly known as: ZOLOFT         OBJECTIVE:   PHYSICAL EXAM: VS: LMP 08/31/2019    EXAM: General: Pt appears well and is in NAD  Neck: General: Supple without adenopathy. Thyroid: Thyroid is surgically removed  Lungs: Clear with good BS bilat with no rales, rhonchi, or wheezes  Heart: Auscultation: RRR.  Abdomen: Normoactive bowel sounds, soft, nontender, without masses or organomegaly palpable  Extremities:  BL LE: No pretibial edema normal ROM and strength.  Neuro: Cranial nerves: II - XII grossly intact  Motor: Normal strength throughout DTRs: 2+ and symmetric in UE without delay in relaxation phase  Mental Status: Judgment, insight:  Intact Orientation: Oriented to time, place, and person  Mood and affect: No depression, anxiety, or agitation     DATA REVIEWED:  *** ASSESSMENT / PLAN / RECOMMENDATIONS:   1. Hx of Papillary Thyroid Carcinoma : S/P total thyroidectomy 2017 (T2 N1a M0)   -Patient with excellent structural and biochemical response to treatment, her last whole-body scan was done in 08/2018 with no evidence of iodine avid cancer. -TSH goal 0.1-0.5 uIU/mL -  Tg and Tb Ab pending  - Ultrasound order 09/2019 has not been performed.   Medications :   Stop Levothyroxine 112 mcg Start Levothyroxine 150 mcg daily     2 . Postoperative Hypothyroidism, Pt in 3rd Trimester :  -Patient is clinically euthyroid  -No local neck symptoms -TSH remains elevated  Despite escalating the levothyroxine dose, will increase again as below     Medications Stop Levothyroxine 112 mcg  Start Levothyroxine 150 mcg daily       F/U in 3 months   Signed electronically by: Mack Guise, MD  [facility] Endocrinology  [facility] [address] [place], [place] [address] Phone: [phone] FAX: [phone]      CC: Fanny Bien, [address] [place] [address] Phone: [phone]  Fax: [phone]   Return to Endocrinology clinic as below: Future Appointments  Date Time Provider [place]  07/29/2020 10:10 AM Jodilyn Giese, Melanie Crazier, MD [facility] None

## 2020-08-05 ENCOUNTER — Ambulatory Visit (INDEPENDENT_AMBULATORY_CARE_PROVIDER_SITE_OTHER): Payer: BC Managed Care – PPO | Admitting: Internal Medicine

## 2020-08-05 ENCOUNTER — Encounter: Payer: Self-pay | Admitting: Internal Medicine

## 2020-08-05 ENCOUNTER — Other Ambulatory Visit: Payer: Self-pay

## 2020-08-05 VITALS — BP 130/76 | HR 79 | Ht 62.0 in | Wt 127.2 lb

## 2020-08-05 DIAGNOSIS — E890 Postprocedural hypothyroidism: Secondary | ICD-10-CM

## 2020-08-05 DIAGNOSIS — C73 Malignant neoplasm of thyroid gland: Secondary | ICD-10-CM

## 2020-08-05 LAB — TSH: TSH: 0.04 u[IU]/mL — ABNORMAL LOW (ref 0.35–4.50)

## 2020-08-05 MED ORDER — LEVOTHYROXINE SODIUM 137 MCG PO TABS: 137.0000 ug | ORAL_TABLET | Freq: Every day | ORAL | 3 refills | Status: DC

## 2020-08-05 NOTE — Progress Notes (Signed)
Name: Donna Mueller  MRN/ DOB: [id number], 27-Nov-1990    Age/ Sex: 30 y.o., female     PCP: Fanny Bien, MD   Reason for Endocrinology Evaluation: Hx of PTC     Initial Endocrinology Clinic Visit: 09/16/2019    PATIENT IDENTIFIER: Donna Mueller is a 30 y.o., female with a past medical history of PTC. She has followed with [facility] since 09/16/2019 for consultative assistance with management of her Hx of PTC.   HISTORICAL SUMMARY:   Pt was diagnosed with thyroid papillary carcinoma in 2016. She is S/P total thyroidectomy in 07/2015, the tumor was 3.0 cm, 9/9 positive lymph nodes ( T2 N1a M0) . She is S/P RAI therapy for remnant ablation  10/2015.  She has an autotransplantation of the parathyroid gland to the left sternocleidomastoid mastoid with Dr. Harlow Asa  In 10/2016 Thyrogen stimulated WBS showed no evidence of iodine -avid metastatic thyroid cancer  08/30/2018 PET and WBS negative   She is S/P NVD 05/27/2020 ( [place])   She had seen Dr. Buddy Duty, Chalmers Cater  And Dr. Denton Lank in the past Paternal grandmother - Hypothyroidism  SUBJECTIVE:    Today (08/05/2020):  Donna Mueller is here for a f/u on post-operative hypothyroidism secondary to PTC.   Weight loss noted since delivery  She is not nursing  No constipation or diarrhea     No local neck symptoms    She is currently on Levothyroxine 150 mcg        HISTORY:  Past Medical History:  Past Medical History:  Diagnosis Date  . ADD (attention deficit disorder)   . Anxiety   . Complication of anesthesia   . Depression   . Difficulty sleeping   . GERD (gastroesophageal reflux disease)   . Headache    HX MIGRAINES  . Knee pain    LEFT  . Papillary thyroid carcinoma ([place])   . PONV (postoperative nausea and vomiting)    Past Surgical History:  Past Surgical History:  Procedure Laterality Date  . LYMPH NODE DISSECTION N/A 07/20/2015   Procedure: LYMPH NODE DISSECTION;  Surgeon: Armandina Gemma,  MD;  Location: [facility];  Service: General;  Laterality: N/A;  . THYROIDECTOMY N/A 07/20/2015   Procedure: TOTAL THYROIDECTOMY WITH LIMITED CENTRAL COMPARTMENT  LYMPH NODE DISSECTION  AND AUTO-TRANSPLANTATION OF LEFT INFERIOR PARATHYROID ;  Surgeon: Armandina Gemma, MD;  Location: [facility];  Service: General;  Laterality: N/A;    Social History:  reports that she has never smoked. She has never used smokeless tobacco. She reports previous alcohol use. She reports that she does not use drugs. Family History:  Family History  Problem Relation Age of Onset  . Glaucoma Mother   . Anxiety disorder Father   . Healthy Brother   . Thyroid disease Paternal Uncle   . Lung cancer Maternal Grandmother   . Diabetes Maternal Grandfather      HOME MEDICATIONS: Allergies as of 08/05/2020   No Known Allergies     Medication List       Accurate as of August 05, 2020  9:25 AM. If you have any questions, ask your nurse or doctor.        acetaminophen 500 MG tablet Commonly known as: TYLENOL Take 500 mg by mouth every 6 (six) hours as needed for mild pain.   levothyroxine 150 MCG tablet Commonly known as: SYNTHROID Take 1 tablet (150 mcg total) by mouth daily.   PRE-NATAL PO Take by mouth.  sertraline 25 MG tablet Commonly known as: ZOLOFT         OBJECTIVE:   PHYSICAL EXAM: VS: BP 130/76   Pulse 79   Ht $R'5\' 2"'Io$  (1.575 m)   Wt 127 lb 4 oz (57.7 kg)   LMP 08/31/2019   SpO2 98%   Breastfeeding No   BMI 23.27 kg/m    EXAM: General: Pt appears well and is in NAD  Neck: General: Supple without adenopathy. Thyroid: Thyroid is surgically removed  Lungs: Clear with good BS bilat with no rales, rhonchi, or wheezes  Heart: Auscultation: RRR.  Abdomen: Normoactive bowel sounds, soft, nontender, without masses or organomegaly palpable  Extremities:  BL LE: No pretibial edema normal ROM and strength.  Mental Status: Judgment, insight: Intact Orientation: Oriented to time, place, and  person  Mood and affect: No depression, anxiety, or agitation     DATA REVIEWED: Results for Donna Mueller, Donna Mueller (MRN [id number]) as of 08/05/2020 12:53  Ref. Range 08/05/2020 09:57  TSH Latest Ref Range: 0.35 - 4.50 uIU/mL 0.04 (L)   ASSESSMENT / PLAN / RECOMMENDATIONS:   1. Hx of Papillary Thyroid Carcinoma : S/P total thyroidectomy 2017 (T2 N1a M0)   -Patient with excellent structural and biochemical response to treatment, her last whole-body scan was done in 08/2018 with no evidence of iodine avid cancer. -TSH goal 0.1-0.5 uIU/mL - TSH suppressed will adjust the dose as below  - Tg, Tg Ab pending  - Will proceed wit ha thyroid bed ultrasound ( the one ordered in 09/2019 has not been done )   Medications :  Stop  Levothyroxine 150 mcg  Start Levothyroxine 137 mcg daily     2 . Postoperative Hypothyroidism  -Patient is clinically euthyroid  -No local neck symptoms -TSH is low, will adjust accordingly    Medications  Stop  Levothyroxine 150 mcg  Start Levothyroxine 137 mcg daily          F/U in 3 months  Labs in 6 weeks   Signed electronically by: Mack Guise, MD  [facility] Endocrinology  [facility] [address], [place] [address] Phone: [phone] FAX: [phone]      CC: Fanny Bien, [address] [place] [address] Phone: [phone]  Fax: [phone]   Return to Endocrinology clinic as below: No future appointments.

## 2020-08-05 NOTE — Patient Instructions (Signed)

## 2020-08-06 LAB — THYROGLOBULIN ANTIBODY: Thyroglobulin Ab: 1 IU/mL (ref <=1)

## 2020-08-06 LAB — THYROGLOBULIN LEVEL: Thyroglobulin: 0.1 ng/mL — ABNORMAL LOW

## 2020-08-25 ENCOUNTER — Other Ambulatory Visit: Payer: BC Managed Care – PPO

## 2020-09-06 ENCOUNTER — Encounter: Payer: Self-pay | Admitting: Internal Medicine

## 2020-09-06 ENCOUNTER — Ambulatory Visit
Admission: RE | Admit: 2020-09-06 | Discharge: 2020-09-06 | Disposition: A | Discharge status: Home / Self Care | Payer: BC Managed Care – PPO | Source: Ambulatory Visit | Attending: Internal Medicine | Admitting: Internal Medicine

## 2020-09-06 DIAGNOSIS — C73 Malignant neoplasm of thyroid gland: Secondary | ICD-10-CM

## 2020-09-06 DIAGNOSIS — E041 Nontoxic single thyroid nodule: Secondary | ICD-10-CM | POA: Diagnosis not present

## 2020-09-08 ENCOUNTER — Telehealth: Payer: Self-pay | Admitting: Internal Medicine

## 2020-09-08 NOTE — Telephone Encounter (Signed)
Spoke to [name] 09/08/2020 at 915 Am    Discussed most likely 2 L.N 6 mm in size, no concerning features, given undetectable Tg we have opted to repeat  Ultrasound in 6 months for stability and proceed from there.     She is in agreement   [place], MD  [facility] Endocrinology  [facility] [address] [place], [place] [address] Phone: [phone] FAX: [phone]

## 2020-09-08 NOTE — Telephone Encounter (Signed)
Left a message to discuss thyroid bed ultrasound 09/08/2020 at 8:50 Am    Abby Nena Jordan, MD  [facility] Endocrinology  [facility] [address] [place], [place] [address] Phone: [phone] FAX: [phone]

## 2020-09-13 ENCOUNTER — Other Ambulatory Visit: Payer: BC Managed Care – PPO

## 2020-09-15 ENCOUNTER — Other Ambulatory Visit (INDEPENDENT_AMBULATORY_CARE_PROVIDER_SITE_OTHER): Payer: BC Managed Care – PPO

## 2020-09-15 ENCOUNTER — Other Ambulatory Visit: Payer: Self-pay

## 2020-09-15 DIAGNOSIS — E890 Postprocedural hypothyroidism: Secondary | ICD-10-CM | POA: Diagnosis not present

## 2020-09-15 LAB — TSH: TSH: 0.1 u[IU]/mL — ABNORMAL LOW (ref 0.35–4.50)

## 2020-09-16 MED ORDER — LEVOTHYROXINE SODIUM 125 MCG PO TABS: 125.0000 ug | ORAL_TABLET | Freq: Every day | ORAL | 2 refills | Status: DC

## 2020-11-03 ENCOUNTER — Other Ambulatory Visit: Payer: Self-pay

## 2020-11-03 ENCOUNTER — Ambulatory Visit (INDEPENDENT_AMBULATORY_CARE_PROVIDER_SITE_OTHER): Payer: BC Managed Care – PPO | Admitting: Internal Medicine

## 2020-11-03 ENCOUNTER — Encounter: Payer: Self-pay | Admitting: Internal Medicine

## 2020-11-03 VITALS — BP 114/78 | HR 68 | Ht 62.0 in | Wt 124.4 lb

## 2020-11-03 DIAGNOSIS — E890 Postprocedural hypothyroidism: Secondary | ICD-10-CM

## 2020-11-03 DIAGNOSIS — C73 Malignant neoplasm of thyroid gland: Secondary | ICD-10-CM | POA: Diagnosis not present

## 2020-11-03 LAB — TSH: TSH: 0.87 u[IU]/mL (ref 0.35–4.50)

## 2020-11-03 NOTE — Patient Instructions (Signed)
-   Continue Levothyroxine 137 mcg daily

## 2020-11-03 NOTE — Progress Notes (Signed)
Name: Donna Mueller  MRN/ DOB: [id number], 09-Nov-1990    Age/ Sex: 30 y.o., female     PCP: Fanny Bien, MD   Reason for Endocrinology Evaluation: Hx of PTC     Initial Endocrinology Clinic Visit: 09/16/2019    PATIENT IDENTIFIER: [name] is a 30 y.o., female with a past medical history of PTC. She has followed with [facility] since 09/16/2019 for consultative assistance with management of her Hx of PTC.   HISTORICAL SUMMARY:   Pt was diagnosed with thyroid papillary carcinoma in 2016. She is S/P total thyroidectomy in 07/2015, the tumor was 3.0 cm, 9/9 positive lymph nodes ( T2 N1a M0) . She is S/P RAI therapy for remnant ablation  10/2015.  She has an autotransplantation of the parathyroid gland to the left sternocleidomastoid mastoid with Dr. Harlow Asa  In 10/2016 Thyrogen stimulated WBS showed no evidence of iodine -avid metastatic thyroid cancer  08/30/2018 PET and WBS negative   She is S/P NVD 05/27/2020 ( [place])    She had seen Dr. Buddy Duty, Chalmers Cater  And Dr. Denton Lank in the past Paternal grandmother - Hypothyroidism  SUBJECTIVE:    Today (11/03/2020):  Donna Mueller is here for a f/u on post-operative hypothyroidism secondary to PTC.   She is not nursing  Weight stable   Has some neck discomfort, has a recent stomach bug    She is currently on Levothyroxine 137 mcg        HISTORY:  Past Medical History:  Past Medical History:  Diagnosis Date  . ADD (attention deficit disorder)   . Anxiety   . Complication of anesthesia   . Depression   . Difficulty sleeping   . GERD (gastroesophageal reflux disease)   . Headache    HX MIGRAINES  . Knee pain    LEFT  . Papillary thyroid carcinoma ([place])   . PONV (postoperative nausea and vomiting)    Past Surgical History:  Past Surgical History:  Procedure Laterality Date  . LYMPH NODE DISSECTION N/A 07/20/2015   Procedure: LYMPH NODE DISSECTION;  Surgeon: Armandina Gemma, MD;  Location: [facility];   Service: General;  Laterality: N/A;  . THYROIDECTOMY N/A 07/20/2015   Procedure: TOTAL THYROIDECTOMY WITH LIMITED CENTRAL COMPARTMENT  LYMPH NODE DISSECTION  AND AUTO-TRANSPLANTATION OF LEFT INFERIOR PARATHYROID ;  Surgeon: Armandina Gemma, MD;  Location: [facility];  Service: General;  Laterality: N/A;    Social History:  reports that she has never smoked. She has never used smokeless tobacco. She reports previous alcohol use. She reports that she does not use drugs. Family History:  Family History  Problem Relation Age of Onset  . Glaucoma Mother   . Anxiety disorder Father   . Healthy Brother   . Thyroid disease Paternal Uncle   . Lung cancer Maternal Grandmother   . Diabetes Maternal Grandfather      HOME MEDICATIONS: Allergies as of 11/03/2020   No Known Allergies     Medication List       Accurate as of November 03, 2020 11:19 AM. If you have any questions, ask your nurse or doctor.        acetaminophen 500 MG tablet Commonly known as: TYLENOL Take 500 mg by mouth every 6 (six) hours as needed for mild pain.   levothyroxine 125 MCG tablet Commonly known as: SYNTHROID Take 1 tablet (125 mcg total) by mouth daily.   PRE-NATAL PO Take by mouth.   sertraline 25 MG tablet  Commonly known as: ZOLOFT         OBJECTIVE:   PHYSICAL EXAM: VS: BP 114/78   Pulse 68   Ht '5\' 2"'  (1.575 m)   Wt 124 lb 6 oz (56.4 kg)   LMP 11/03/2020   SpO2 98%   BMI 22.75 kg/m    EXAM: General: Pt appears well and is in NAD  Neck: General: Supple without adenopathy. Thyroid: Thyroid is surgically removed  Lungs: Clear with good BS bilat with no rales, rhonchi, or wheezes  Heart: Auscultation: RRR.  Abdomen: Normoactive bowel sounds, soft, nontender, without masses or organomegaly palpable  Extremities:  BL LE: No pretibial edema normal ROM and strength.  Mental Status: Judgment, insight: Intact Orientation: Oriented to time, place, and person  Mood and affect: No depression, anxiety,  or agitation     DATA REVIEWED: Results for KIMANI, BEDOYA (MRN [id number]) as of 11/05/2020 08:08  Ref. Range 11/03/2020 11:35  TSH Latest Ref Range: 0.35 - 4.50 uIU/mL 0.87  Thyroglobulin Latest Units: ng/mL <0.1 (L)  Thyroglobulin Ab Latest Ref Range: < or = 1 IU/mL 2 (H)       Thyroid ultrasound 08/2020  Isthmus: Surgically absent. There is no residual nodular soft tissue within the isthmic resection bed.  Right lobe: There are 2 punctate hypoechoic nodules within the right lobectomy resection bed with dominant nodule measuring approximately 0.6 x 0.4 cm (labeled 1) and adjacent nodule measuring 0.4 x 0.3 x 0.3 cm (labeled 2), neither of which were definitively seen on the 07/2017 examination.  Left lobe: Surgically absent. There is no residual nodular soft tissue within the left lobectomy resection bed.  _________________________________________________________  Solitary right-sided cervical lymph node is not enlarged by size criteria measuring 0.5 cm in greatest short axis diameter. No regional cervical lymphadenopathy.  IMPRESSION: Two punctate (sub 6 mm) hypoechoic nodules within the right lobectomy resection bed, nonspecific and while potentially representative of non pathologically enlarged cervical lymph nodes, recurrent disease/tissue could have a similar appearance. Correlation with thyroglobulin levels could be performed as indicated.   ASSESSMENT / PLAN / RECOMMENDATIONS:   1. Hx of Papillary Thyroid Carcinoma : S/P total thyroidectomy 2017 (T2 N1a M0)   -Patient with excellent  biochemical response but indeterminate structural response to treatment, her last whole-body scan was done in 08/2018 with no evidence of iodine avid cancer, will repeat WBS due to recent development of 6 mm right thyroid bed lymph nodes and Tg antibodies  -TSH goal 0.1-0.5 uIU/mL     2 . Postoperative Hypothyroidism  -Patient is clinically euthyroid  -No local neck  symptoms -TSH is slightly above goal, will not change at this time, will adjust if this remains at the same level in 8 weeks    Medications  Levothyroxine 137 mcg daily      F/U in 6 months  Labs in 8 weeks   Signed electronically by: Donna Guise, MD  [facility]  [facility] [address] [place], [place] [address] Phone: [phone] FAX: [phone]      CC: Fanny Bien, [address] [place] [address] Phone: [phone]  Fax: [phone]   Return to Endocrinology clinic as below: No future appointments.

## 2020-11-04 ENCOUNTER — Encounter: Payer: Self-pay | Admitting: Internal Medicine

## 2020-11-04 LAB — THYROGLOBULIN LEVEL: Thyroglobulin: <0.1 ng/mL — ABNORMAL LOW

## 2020-11-04 LAB — THYROGLOBULIN ANTIBODY: Thyroglobulin Ab: 2 IU/mL — ABNORMAL HIGH (ref <=1)

## 2020-12-13 ENCOUNTER — Other Ambulatory Visit: Payer: Self-pay

## 2020-12-13 ENCOUNTER — Encounter (HOSPITAL_COMMUNITY): Payer: Self-pay

## 2020-12-13 ENCOUNTER — Encounter (HOSPITAL_COMMUNITY)
Admission: RE | Admit: 2020-12-13 | Discharge: 2020-12-13 | Disposition: A | Discharge status: Home / Self Care | Payer: Self-pay | Source: Ambulatory Visit | Attending: Internal Medicine | Admitting: Internal Medicine

## 2020-12-13 DIAGNOSIS — C73 Malignant neoplasm of thyroid gland: Secondary | ICD-10-CM | POA: Insufficient documentation

## 2020-12-13 MED ORDER — THYROTROPIN ALFA 0.9 MG IM SOLR
0.9000 mg | INTRAMUSCULAR | Status: AC
  Administered 2020-12-13: 0.9 mg via INTRAMUSCULAR

## 2020-12-13 MED ORDER — STERILE WATER FOR INJECTION IJ SOLN
INTRAMUSCULAR | Status: AC
  Filled 2020-12-13: qty 10

## 2020-12-14 ENCOUNTER — Encounter (HOSPITAL_COMMUNITY)
Admission: RE | Admit: 2020-12-14 | Discharge: 2020-12-14 | Disposition: A | Discharge status: Home / Self Care | Payer: BC Managed Care – PPO | Source: Ambulatory Visit | Attending: Internal Medicine | Admitting: Internal Medicine

## 2020-12-14 MED ORDER — STERILE WATER FOR INJECTION IJ SOLN
INTRAMUSCULAR | Status: AC
  Administered 2020-12-14: 10 mL
  Filled 2020-12-14: qty 10

## 2020-12-14 MED ORDER — THYROTROPIN ALFA 0.9 MG IM SOLR
0.9000 mg | INTRAMUSCULAR | Status: AC
  Administered 2020-12-14: 0.9 mg via INTRAMUSCULAR

## 2020-12-15 ENCOUNTER — Other Ambulatory Visit: Payer: Self-pay

## 2020-12-15 ENCOUNTER — Encounter (HOSPITAL_COMMUNITY)
Admission: RE | Admit: 2020-12-15 | Discharge: 2020-12-15 | Disposition: A | Discharge status: Home / Self Care | Payer: Self-pay | Source: Ambulatory Visit | Attending: Internal Medicine | Admitting: Internal Medicine

## 2020-12-15 LAB — HCG, SERUM, QUALITATIVE: Preg, Serum: NEGATIVE

## 2020-12-15 MED ORDER — SODIUM IODIDE I 131 CAPSULE
4.0000 | Freq: Once | INTRAVENOUS | Status: AC | PRN
  Administered 2020-12-15: 4 via ORAL

## 2020-12-17 ENCOUNTER — Other Ambulatory Visit: Payer: Self-pay

## 2020-12-17 ENCOUNTER — Encounter (HOSPITAL_COMMUNITY)
Admission: RE | Admit: 2020-12-17 | Discharge: 2020-12-17 | Disposition: A | Discharge status: Home / Self Care | Payer: Self-pay | Source: Ambulatory Visit | Attending: Internal Medicine | Admitting: Internal Medicine

## 2020-12-20 ENCOUNTER — Encounter: Payer: Self-pay | Admitting: Internal Medicine

## 2020-12-23 ENCOUNTER — Other Ambulatory Visit: Payer: Self-pay | Admitting: Internal Medicine

## 2020-12-23 ENCOUNTER — Encounter: Payer: Self-pay | Admitting: Internal Medicine

## 2021-02-01 ENCOUNTER — Other Ambulatory Visit: Payer: Self-pay | Admitting: Internal Medicine

## 2021-02-01 DIAGNOSIS — C73 Malignant neoplasm of thyroid gland: Secondary | ICD-10-CM

## 2021-02-01 DIAGNOSIS — E890 Postprocedural hypothyroidism: Secondary | ICD-10-CM

## 2021-02-02 ENCOUNTER — Other Ambulatory Visit: Payer: BC Managed Care – PPO

## 2021-02-04 ENCOUNTER — Ambulatory Visit: Payer: BC Managed Care – PPO | Admitting: Internal Medicine

## 2021-03-28 ENCOUNTER — Other Ambulatory Visit: Payer: Self-pay

## 2021-03-28 ENCOUNTER — Other Ambulatory Visit (INDEPENDENT_AMBULATORY_CARE_PROVIDER_SITE_OTHER): Payer: Self-pay

## 2021-03-28 DIAGNOSIS — C73 Malignant neoplasm of thyroid gland: Secondary | ICD-10-CM

## 2021-03-28 DIAGNOSIS — E890 Postprocedural hypothyroidism: Secondary | ICD-10-CM

## 2021-03-28 LAB — TSH: TSH: 2.48 u[IU]/mL (ref 0.35–5.50)

## 2021-03-31 ENCOUNTER — Other Ambulatory Visit: Payer: Self-pay

## 2021-03-31 ENCOUNTER — Ambulatory Visit (INDEPENDENT_AMBULATORY_CARE_PROVIDER_SITE_OTHER): Payer: 59 | Admitting: Internal Medicine

## 2021-03-31 ENCOUNTER — Encounter: Payer: Self-pay | Admitting: Internal Medicine

## 2021-03-31 VITALS — BP 112/72 | HR 77 | Ht 62.0 in | Wt 129.0 lb

## 2021-03-31 DIAGNOSIS — E890 Postprocedural hypothyroidism: Secondary | ICD-10-CM | POA: Diagnosis not present

## 2021-03-31 DIAGNOSIS — C73 Malignant neoplasm of thyroid gland: Secondary | ICD-10-CM

## 2021-03-31 MED ORDER — LEVOTHYROXINE SODIUM 137 MCG PO TABS: 137.0000 ug | ORAL_TABLET | Freq: Every day | ORAL | 3 refills | Status: AC

## 2021-03-31 NOTE — Progress Notes (Signed)
Name: Donna Mueller  MRN/ DOB: [id number], [birth year]    Age/ Sex: 30 y.o., female     PCP: Fanny Bien, MD   Reason for Endocrinology Evaluation: Hx of PTC     Initial Endocrinology Clinic Visit: 09/16/2019    PATIENT IDENTIFIER: Donna Mueller is a 30 y.o., female with a past medical history of PTC. She has followed with [facility] since 09/16/2019 for consultative assistance with management of her Hx of PTC.   HISTORICAL SUMMARY:   Pt was diagnosed with thyroid papillary carcinoma in 2016. She is S/P total thyroidectomy in 07/2015, the tumor was 3.0 cm, 9/9 positive lymph nodes ( T2 N1a M0) . She is S/P RAI therapy for remnant ablation  10/2015.  She has an autotransplantation of the parathyroid gland to the left sternocleidomastoid mastoid with Dr. Harlow Asa   In 10/2016 Thyrogen stimulated WBS showed no evidence of iodine -avid metastatic thyroid cancer  08/30/2018 PET and WBS negative   WBS negative 12/2020    She is S/P NVD 05/27/2020 ( Boy Donna Mueller)      She had seen Dr. Buddy Duty, Chalmers Cater  And Dr. Denton Lank in the past Paternal grandmother - Hypothyroidism  SUBJECTIVE:    Today (03/31/2021):  [name] is here for a f/u on post-operative hypothyroidism secondary to PTC.   She has noticed decreased energy  Denies constipation or diarrhea  Has been having palpitations  Denies local neck swelling    She is currently on Levothyroxine 125  mcg        HISTORY:  Past Medical History:  Past Medical History:  Diagnosis Date   ADD (attention deficit disorder)    Anxiety    Complication of anesthesia    Depression    Difficulty sleeping    GERD (gastroesophageal reflux disease)    Headache    HX MIGRAINES   Knee pain    LEFT   Papillary thyroid carcinoma ([facility])    PONV (postoperative nausea and vomiting)    Past Surgical History:  Past Surgical History:  Procedure Laterality Date   LYMPH NODE DISSECTION N/A 07/20/2015   Procedure: LYMPH NODE  DISSECTION;  Surgeon: Armandina Gemma, MD;  Location: [facility];  Service: General;  Laterality: N/A;   THYROIDECTOMY N/A 07/20/2015   Procedure: TOTAL THYROIDECTOMY WITH LIMITED CENTRAL COMPARTMENT  LYMPH NODE DISSECTION  AND AUTO-TRANSPLANTATION OF LEFT INFERIOR PARATHYROID ;  Surgeon: Armandina Gemma, MD;  Location: [facility];  Service: General;  Laterality: N/A;   Social History:  reports that she has never smoked. She has never used smokeless tobacco. She reports that she does not currently use alcohol. She reports that she does not use drugs. Family History:  Family History  Problem Relation Age of Onset   Glaucoma Mother    Anxiety disorder Father    Healthy Brother    Thyroid disease Paternal Uncle    Lung cancer Maternal Grandmother    Diabetes Maternal Grandfather      HOME MEDICATIONS: Allergies as of 03/31/2021   No Known Allergies      Medication List        Accurate as of March 31, 2021  8:43 AM. If you have any questions, ask your nurse or doctor.          STOP taking these medications    PRE-NATAL PO Stopped by: Dorita Sciara, MD       TAKE these medications    acetaminophen 500 MG tablet Commonly known as:  TYLENOL Take 500 mg by mouth every 6 (six) hours as needed for mild pain.   Blisovi FE 1/20 1-20 MG-MCG tablet Generic drug: norethindrone-ethinyl estradiol-FE Take 1 tablet by mouth daily.   buPROPion 150 MG 24 hr tablet Commonly known as: WELLBUTRIN XL Take 150 mg by mouth daily.   levothyroxine 137 MCG tablet Commonly known as: SYNTHROID Take 1 tablet (137 mcg total) by mouth daily before breakfast. What changed:  medication strength See the new instructions. Changed by: Dorita Sciara, MD   sertraline 25 MG tablet Commonly known as: ZOLOFT          OBJECTIVE:   PHYSICAL EXAM: VS: BP 112/72 (BP Location: Left Arm, Patient Position: Sitting, Cuff Size: Small)   Pulse 77   Ht '5\' 2"'  (1.575 m)   Wt 129 lb (58.5 kg)    SpO2 97%   BMI 23.59 kg/m    EXAM: General: Pt appears well and is in NAD  Neck: General: Supple without adenopathy. Thyroid: Thyroid is surgically removed  Lungs: Clear with good BS bilat with no rales, rhonchi, or wheezes  Heart: Auscultation: RRR.  Abdomen: Normoactive bowel sounds, soft, nontender, without masses or organomegaly palpable  Extremities:  BL LE: No pretibial edema normal ROM and strength.  Mental Status: Judgment, insight: Intact Orientation: Oriented to time, place, and person  Mood and affect: No depression, anxiety, or agitation     DATA REVIEWED: Results for TYRONDA, VIZCARRONDO (MRN [id number]) as of 03/31/2021 07:09  Ref. Range 03/28/2021 09:30  TSH Latest Ref Range: 0.35 - 5.50 uIU/mL 2.48  Thyroglobulin Antibody Latest Ref Range: 0.0 - 0.9 IU/mL 2.4 (H)      Thyroid ultrasound 08/2020  Isthmus: Surgically absent. There is no residual nodular soft tissue within the isthmic resection bed.   Right lobe: There are 2 punctate hypoechoic nodules within the right lobectomy resection bed with dominant nodule measuring approximately 0.6 x 0.4 cm (labeled 1) and adjacent nodule measuring 0.4 x 0.3 x 0.3 cm (labeled 2), neither of which were definitively seen on the 07/2017 examination.   Left lobe: Surgically absent. There is no residual nodular soft tissue within the left lobectomy resection bed.   _________________________________________________________   Solitary right-sided cervical lymph node is not enlarged by size criteria measuring 0.5 cm in greatest short axis diameter. No regional cervical lymphadenopathy.   IMPRESSION: Two punctate (sub 6 mm) hypoechoic nodules within the right lobectomy resection bed, nonspecific and while potentially representative of non pathologically enlarged cervical lymph nodes, recurrent disease/tissue could have a similar appearance. Correlation with thyroglobulin levels could be performed as indicated.  WBS  12/17/2020  No foci of radiotracer activity within the thyroid bed. No evidence of distant metastatic disease on whole-body scan.   Physiologic activity noted in the salivary glands and GI tract.   IMPRESSION: No evidence of local thyroid cancer recurrence or metastatic disease by I 131 imaging.     ASSESSMENT / PLAN / RECOMMENDATIONS:   Hx of Papillary Thyroid Carcinoma : S/P total thyroidectomy 2017 (T2 N1a M0)    -Patient with excellent  biochemical response but indeterminate structural response to treatment, her last whole-body scan was done in 12/2020 with no evidence of iodine avid cancer,  -On her last thyroid bed ultrasound she was noted to have 6 mm right thyroid bed lymph node, will have low threshold for PET scan if TG elevated -We will repeat thyroid ultrasound -TSH goal 0.5- 2.0 uIU/mL     2 . Postoperative Hypothyroidism   -  Patient is clinically euthyroid  -No local neck symptoms -TSH is slightly above goal, will not change at this time, will adjust if this remains at the same level in 8 weeks     Medications Stop levothyroxine 125 MCG Start levothyroxine 137 mcg daily      F/U in 6 months  Labs in 8 weeks   Signed electronically by: Mack Guise, MD  [facility]  [address] [facility] [address] [place], [place] [address] Phone: [phone] FAX: [phone]      CC: Fanny Bien, [address] [place] [address] Phone: [phone]  Fax: [phone]   Return to Endocrinology clinic as below: No future appointments.

## 2021-03-31 NOTE — Patient Instructions (Signed)
Increase Levothyroxine to 137 mcg daily

## 2021-04-04 ENCOUNTER — Ambulatory Visit
Admission: RE | Admit: 2021-04-04 | Discharge: 2021-04-04 | Disposition: A | Discharge status: Home / Self Care | Payer: 59 | Source: Ambulatory Visit | Attending: Internal Medicine | Admitting: Internal Medicine

## 2021-04-04 ENCOUNTER — Other Ambulatory Visit: Payer: 59

## 2021-04-04 DIAGNOSIS — C73 Malignant neoplasm of thyroid gland: Secondary | ICD-10-CM

## 2021-04-05 LAB — THYROGLOBULIN BY LCMS: Thyroglobulin by LCMS: <0.2 ng/mL — ABNORMAL LOW (ref 1.5–38.5)

## 2021-04-05 LAB — TGAB+THYROGLOBULIN IMA OR LCMS: Thyroglobulin Antibody: 2.4 IU/mL — ABNORMAL HIGH (ref 0.0–0.9)

## 2021-04-06 ENCOUNTER — Other Ambulatory Visit: Payer: Self-pay | Admitting: Internal Medicine

## 2021-04-06 DIAGNOSIS — C73 Malignant neoplasm of thyroid gland: Secondary | ICD-10-CM

## 2021-04-18 ENCOUNTER — Encounter: Payer: Self-pay | Admitting: Internal Medicine

## 2021-05-16 ENCOUNTER — Other Ambulatory Visit: Payer: 59

## 2021-06-15 ENCOUNTER — Other Ambulatory Visit: Payer: Self-pay | Admitting: Obstetrics & Gynecology

## 2021-06-15 DIAGNOSIS — R1011 Right upper quadrant pain: Secondary | ICD-10-CM

## 2021-06-22 ENCOUNTER — Ambulatory Visit
Admission: RE | Admit: 2021-06-22 | Discharge: 2021-06-22 | Disposition: A | Discharge status: Home / Self Care | Payer: 59 | Source: Ambulatory Visit | Attending: Obstetrics & Gynecology | Admitting: Obstetrics & Gynecology

## 2021-06-22 DIAGNOSIS — R1011 Right upper quadrant pain: Secondary | ICD-10-CM

## 2021-06-23 ENCOUNTER — Telehealth: Payer: Self-pay | Admitting: *Deleted

## 2021-06-23 NOTE — Telephone Encounter (Signed)
-----   Message from Jerene Bears, MD sent at 06/23/2021  8:05 AM EST ----- Dottie Please get this lady an appt ASAP. APP or available provider.  RUQ pain, weight loss, poor appetite, hx of thyroid CA.  Recent US abd normal. Her cell is [phone] Please let me know when she will be seen.  Thanks [employer]

## 2021-06-23 NOTE — Telephone Encounter (Signed)
Patient offered appointment with Nicoletta Ba, PA-C for today at 130 pm. Patient indicates she would not be able to come to this appointment. She has been scheduled for our next available appointment with Alonza Bogus, PA-C on 06/29/21. Patient verbalizes understanding.

## 2021-06-28 ENCOUNTER — Other Ambulatory Visit: Payer: 59

## 2021-06-29 ENCOUNTER — Encounter: Payer: Self-pay | Admitting: Gastroenterology

## 2021-06-29 ENCOUNTER — Other Ambulatory Visit (INDEPENDENT_AMBULATORY_CARE_PROVIDER_SITE_OTHER): Payer: 59

## 2021-06-29 ENCOUNTER — Ambulatory Visit (INDEPENDENT_AMBULATORY_CARE_PROVIDER_SITE_OTHER): Payer: 59 | Admitting: Gastroenterology

## 2021-06-29 VITALS — BP 124/88 | HR 76 | Ht 62.0 in | Wt 123.0 lb

## 2021-06-29 DIAGNOSIS — R197 Diarrhea, unspecified: Secondary | ICD-10-CM

## 2021-06-29 DIAGNOSIS — R1011 Right upper quadrant pain: Secondary | ICD-10-CM | POA: Diagnosis not present

## 2021-06-29 DIAGNOSIS — R112 Nausea with vomiting, unspecified: Secondary | ICD-10-CM

## 2021-06-29 LAB — COMPREHENSIVE METABOLIC PANEL
ALT: 9 U/L (ref 0–35)
AST: 11 U/L (ref 0–37)
Albumin: 4 g/dL (ref 3.5–5.2)
Alkaline Phosphatase: 41 U/L (ref 39–117)
BUN: 7 mg/dL (ref 6–23)
CO2: 27 mEq/L (ref 19–32)
Calcium: 9 mg/dL (ref 8.4–10.5)
Chloride: 106 mEq/L (ref 96–112)
Creatinine, Ser: 0.65 mg/dL (ref 0.40–1.20)
GFR: 119.77 mL/min (ref >60.00)
Glucose, Bld: 92 mg/dL (ref 70–99)
Potassium: 3.7 mEq/L (ref 3.5–5.1)
Sodium: 140 mEq/L (ref 135–145)
Total Bilirubin: 0.5 mg/dL (ref 0.2–1.2)
Total Protein: 6.6 g/dL (ref 6.0–8.3)

## 2021-06-29 LAB — C-REACTIVE PROTEIN: CRP: <1 mg/dL (ref 0.5–20.0)

## 2021-06-29 LAB — SEDIMENTATION RATE: Sed Rate: 10 mm/hr (ref 0–20)

## 2021-06-29 NOTE — Progress Notes (Signed)
06/29/2021 Donna Mueller [id number] [birth year]   HISTORY OF PRESENT ILLNESS: This is a pleasant 30 year old female who is new to our office.  She has been referred here to Dr. Hilarie Fredrickson by Dr. Linda Hedges for evaluation of nausea, vomiting, abdominal pain, diarrhea.  She has a history of thyroid cancer age 89.  She tells me that she does not really carry any diagnosis of IBS, but has always had a questionable stomach, but not to this degree.  She says that this all started about 6 months ago after eating at a wedding.  She says that she got really sick after that, but initially attributed to some type of viral gastroenteritis, etc.  She says that she has continued to have issues after eating periodically, however.  She says that intermittently she will have vomiting and/or diarrhea after eating.  She says that about a month ago she ate hibachi and had a lot of diarrhea and vomiting with that and her symptoms have been more signifcant since that time.  She reports abdominal pain localized mostly on the right upper quadrant, but sometimes some lower abdominal pain as well.  She also then reports intermittent issues with constipation as well.  No blood in the stool.  No family history of any gastrointestinal issues.  She says that she has been eating very small amounts and trying to eat very bland as she is afraid to have recurrent symptoms.  CBC, CMP, thyroid studies, abdominal ultrasound are all normal/unremarkable.   Past Medical History:  Diagnosis Date   ADD (attention deficit disorder)    Anxiety    Complication of anesthesia    Depression    Difficulty sleeping    GERD (gastroesophageal reflux disease)    Headache    HX MIGRAINES   Knee pain    LEFT   Papillary thyroid carcinoma ([facility])    PONV (postoperative nausea and vomiting)    Past Surgical History:  Procedure Laterality Date   LYMPH NODE DISSECTION N/A 07/20/2015   Procedure: LYMPH NODE DISSECTION;  Surgeon: Armandina Gemma, MD;   Location: [facility];  Service: General;  Laterality: N/A;   THYROIDECTOMY N/A 07/20/2015   Procedure: TOTAL THYROIDECTOMY WITH LIMITED CENTRAL COMPARTMENT  LYMPH NODE DISSECTION  AND AUTO-TRANSPLANTATION OF LEFT INFERIOR PARATHYROID ;  Surgeon: Armandina Gemma, MD;  Location: [facility];  Service: General;  Laterality: N/A;    reports that she has never smoked. She has never used smokeless tobacco. She reports that she does not currently use alcohol. She reports that she does not use drugs. family history includes Anxiety disorder in her father; Diabetes in her maternal grandfather; Glaucoma in her mother; Healthy in her brother; Lung cancer in her maternal grandmother; Thyroid disease in her paternal uncle. No Known Allergies    Outpatient Encounter Medications as of 06/29/2021  Medication Sig   acetaminophen (TYLENOL) 500 MG tablet Take 500 mg by mouth every 6 (six) hours as needed for mild pain.    BLISOVI FE 1/20 1-20 MG-MCG tablet Take 1 tablet by mouth daily.   buPROPion (WELLBUTRIN XL) 150 MG 24 hr tablet Take 150 mg by mouth daily.   levothyroxine (SYNTHROID) 137 MCG tablet Take 1 tablet (137 mcg total) by mouth daily before breakfast.   sertraline (ZOLOFT) 25 MG tablet    No facility-administered encounter medications on file as of 06/29/2021.     REVIEW OF SYSTEMS  : All other systems reviewed and negative except where noted in the History of  Present Illness.   PHYSICAL EXAM: BP 124/88    Pulse 76    Ht 5\' 2"  (1.575 m)    Wt 123 lb (55.8 kg)    LMP 05/25/2021 (Approximate)    BMI 22.50 kg/m  General: Well developed white female in no acute distress Head: Normocephalic and atraumatic Eyes:  Sclerae anicteric, conjunctiva pink. Ears: Normal auditory acuity Lungs: Clear throughout to auscultation; no W/R/R. Heart: Regular rate and rhythm; no M/R/G. Abdomen: Soft, non-distended.  BS present.  Non-tender. Musculoskeletal: Symmetrical with no gross deformities  Skin: No lesions on visible  extremities Extremities: No edema  Neurological: Alert oriented x 4, grossly non-focal Psychological:  Alert and cooperative. Normal mood and affect  ASSESSMENT AND PLAN: *12- year-old female with 6 months of intermittent episodes of vomiting and diarrhea as well as some right-sided abdominal pain primarily occurring after eating.  CBC, CMP, thyroid labs, and ultrasound all okay.  No family history of any GI issues.  No definite diagnosis of IBS, but she reports that she has always had a questionable stomach, but not to this degree.  Could be biliary dysfunction vs IBS vs IBD  We will check a HIDA scan with CCK.  We will also check celiac labs, sed rate, CRP.  Pending results may end up needing endoscopic procedures.  She will try to eat bland diet, small frequent meals in the interim, avoiding fatty/fried/greasy foods.  Offered her antiemetic, but she declined for now.   CC:  Fanny Bien, MD

## 2021-06-29 NOTE — Patient Instructions (Signed)
If you are age 28 or older, your body mass index should be between 23-30. Your Body mass index is 22.5 kg/m. If this is out of the aforementioned range listed, please consider follow up with your Primary Care Provider.  If you are age 16 or younger, your body mass index should be between 19-25. Your Body mass index is 22.5 kg/m. If this is out of the aformentioned range listed, please consider follow up with your Primary Care Provider.   ________________________________________________________  The [facility] providers would like to encourage you to use [facility] to communicate with providers for non-urgent requests or questions.  Due to long hold times on the telephone, sending your provider a message by [facility] may be a faster and more efficient way to get a response.  Please allow 48 business hours for a response.  Please remember that this is for non-urgent requests.  _______________________________________________________  Your provider has requested that you go to the basement level for lab work before leaving today. Press "B" on the elevator. The lab is located at the first door on the left as you exit the elevator.  You have been scheduled for a HIDA scan at [facility] (1st floor) on 07/06/21. Please arrive 15 minutes prior to your scheduled appointment at  4:03KV. Make certain not to have anything to eat or drink at least 6 hours prior to your test. Should this appointment date or time not work well for you, please call radiology scheduling at [phone].  _____________________________________________________________________ hepatobiliary (HIDA) scan is an imaging procedure used to diagnose problems in the liver, gallbladder and bile ducts. In the HIDA scan, a radioactive chemical or tracer is injected into a vein in your arm. The tracer is handled by the liver like bile. Bile is a fluid produced and excreted by your liver that helps your digestive system break down fats in the  foods you eat. Bile is stored in your gallbladder and the gallbladder releases the bile when you eat a meal. A special nuclear medicine scanner (gamma camera) tracks the flow of the tracer from your liver into your gallbladder and small intestine.  During your HIDA scan  You'll be asked to change into a hospital gown before your HIDA scan begins. Your health care team will position you on a table, usually on your back. The radioactive tracer is then injected into a vein in your arm.The tracer travels through your bloodstream to your liver, where it's taken up by the bile-producing cells. The radioactive tracer travels with the bile from your liver into your gallbladder and through your bile ducts to your small intestine.You may feel some pressure while the radioactive tracer is injected into your vein. As you lie on the table, a special gamma camera is positioned over your abdomen taking pictures of the tracer as it moves through your body. The gamma camera takes pictures continually for about an hour. You'll need to keep still during the HIDA scan. This can become uncomfortable, but you may find that you can lessen the discomfort by taking deep breaths and thinking about other things. Tell your health care team if you're uncomfortable. The radiologist will watch on a computer the progress of the radioactive tracer through your body. The HIDA scan may be stopped when the radioactive tracer is seen in the gallbladder and enters your small intestine. This typically takes about an hour. In some cases extra imaging will be performed if original images aren't satisfactory, if morphine is given to help  visualize the gallbladder or if the medication CCK is given to look at the contraction of the gallbladder. This test typically takes 2 hours to complete. ________________________________________________________________________

## 2021-06-30 ENCOUNTER — Other Ambulatory Visit: Payer: 59

## 2021-06-30 DIAGNOSIS — R112 Nausea with vomiting, unspecified: Secondary | ICD-10-CM

## 2021-06-30 LAB — IGA: Immunoglobulin A: 104 mg/dL (ref 47–310)

## 2021-06-30 NOTE — Progress Notes (Signed)
Addendum: Reviewed and agree with assessment and management plan. Zian Mohamed M, MD  

## 2021-07-01 LAB — TISSUE TRANSGLUTAMINASE ABS,IGG,IGA
(tTG) Ab, IgA: <1 U/mL
(tTG) Ab, IgG: <1 U/mL

## 2021-07-06 ENCOUNTER — Ambulatory Visit (HOSPITAL_COMMUNITY): Payer: 59

## 2021-08-08 ENCOUNTER — Other Ambulatory Visit: Payer: Self-pay

## 2021-08-08 ENCOUNTER — Encounter: Payer: Self-pay | Admitting: Internal Medicine

## 2021-08-08 ENCOUNTER — Ambulatory Visit (HOSPITAL_COMMUNITY)
Admission: RE | Admit: 2021-08-08 | Discharge: 2021-08-08 | Disposition: A | Discharge status: Home / Self Care | Payer: No Typology Code available for payment source | Source: Ambulatory Visit | Attending: Gastroenterology | Admitting: Gastroenterology

## 2021-08-08 DIAGNOSIS — R112 Nausea with vomiting, unspecified: Secondary | ICD-10-CM | POA: Insufficient documentation

## 2021-08-08 MED ORDER — TECHNETIUM TC 99M MEBROFENIN IV KIT
5.3000 | PACK | Freq: Once | INTRAVENOUS | Status: AC | PRN
  Administered 2021-08-08: 5.3 via INTRAVENOUS

## 2021-08-11 ENCOUNTER — Other Ambulatory Visit: Payer: Self-pay

## 2021-08-18 ENCOUNTER — Telehealth: Payer: Self-pay | Admitting: Internal Medicine

## 2021-08-18 ENCOUNTER — Other Ambulatory Visit: Payer: Self-pay

## 2021-08-18 MED ORDER — RIFAXIMIN 550 MG PO TABS: 550.0000 mg | ORAL_TABLET | Freq: Three times a day (TID) | ORAL | 0 refills | Status: DC

## 2021-08-18 NOTE — Telephone Encounter (Signed)
Spoke to patient regarding HIDA scan which showed decreased EF  She does continue to have right upper quadrant pain and nausea after eating At times this overlaps with bloating and loose stools which can be urgent postprandially  I think it is possible that she has both biliary dyskinesia but also irritable bowel Her inflammatory markers were negative  Surgical consultation appointment in place for early March; I recommend she continue with this for biliary dyskinesia and recurrent right upper quadrant pain  I have also recommended IBS D treatment with rifaximin 550 mg 3 times daily x14 days Please call this into the [facility] on [address]  Time provided for questions and answers and she thanked me for the call

## 2021-08-18 NOTE — Telephone Encounter (Signed)
Prescription sent to pharmacy.

## 2021-08-22 ENCOUNTER — Encounter: Payer: Self-pay | Admitting: Internal Medicine

## 2021-08-22 ENCOUNTER — Other Ambulatory Visit: Payer: Self-pay

## 2021-08-22 ENCOUNTER — Other Ambulatory Visit (INDEPENDENT_AMBULATORY_CARE_PROVIDER_SITE_OTHER): Payer: Self-pay

## 2021-08-22 DIAGNOSIS — C73 Malignant neoplasm of thyroid gland: Secondary | ICD-10-CM

## 2021-08-22 LAB — TSH: TSH: 0.27 u[IU]/mL — ABNORMAL LOW (ref 0.35–5.50)

## 2021-08-23 MED ORDER — RIFAXIMIN 550 MG PO TABS: 550.0000 mg | ORAL_TABLET | Freq: Two times a day (BID) | ORAL | 0 refills | Status: DC

## 2021-08-23 NOTE — Telephone Encounter (Signed)
Insurance has denied Xifaxan.  Plan member ID: [id number] Case number: [id number] Prescriber name: Zenovia Jarred Prescriber fax: [phone] NOTICE OF DENIAL Dear Howell Rucks, On behalf of [employer], [facility] is responsible for reviewing pharmacy services provided to [employer] members. We received a request from your prescriber for coverage of Xifaxan Tab 550mg . We reviewed all of the information you and/or your doctor sent to [place] and sent the information to an appropriate physician specialist if needed. Unfortunately, we must deny coverage for Xifaxan. Why was my request denied? This request was denied because you did not meet the following clinical requirements: The requested medication is not covered because it is not on the listing or formulary of approved drugs for your plan benefit. Please discuss alternative drug therapy with your doctor. The request for coverage for XIFAXAN TAB 550MG , use as directed (42 per 14 days), is denied. This decision is based on health plan criteria for XIFAXAN TAB 550MG . This medicine is covered only if: All of the following: (A) You have failed or cannot use a tricyclic antidepressant (for example, amitriptyline). (B) One of the following: (I) You have failed or cannot use Viberzi*. (II) You have a history of or potential for a substance abuse disorder. The information provided does not show that you meet the criteria listed above. *Please note: This product may require prior authorization.  Please advise.Marland KitchenMarland KitchenMarland Kitchen

## 2021-08-23 NOTE — Addendum Note (Signed)
Addended by: Larina Bras on: 08/23/2021 01:43 PM   Modules accepted: Orders

## 2021-08-23 NOTE — Telephone Encounter (Signed)
Rx resent.

## 2021-08-23 NOTE — Telephone Encounter (Signed)
Try to resend [facility]

## 2021-08-31 ENCOUNTER — Encounter: Payer: Self-pay | Admitting: Internal Medicine

## 2021-09-05 NOTE — Telephone Encounter (Signed)
Received fax from cover my meds for PA for xifaxan 550 mg. Filled out PA and faxed back to [facility]. Will await outcome.

## 2021-09-06 NOTE — Telephone Encounter (Signed)
-----   Message ----- From: Jerene Bears, MD ([place]) Sent: 09/05/2021   5:10 PM EST To: Larina Bras, CMA ([place]) Subject: Melton AlarDoreene Nest TAB 550MG  denied prior authorization for [place]  Neither TCA or Viberzi are recommended for this patient Please see with this medical information from me will allow coverage of rifaximin Thanks JMP

## 2021-09-09 NOTE — Telephone Encounter (Signed)
Patient is advised that we have placed full course of xifaxan samples at the front desk for her to pick up. She verbalizes understanding of this information. ?

## 2021-09-26 ENCOUNTER — Ambulatory Visit: Payer: Self-pay | Admitting: Internal Medicine

## 2021-09-26 ENCOUNTER — Encounter: Payer: Self-pay | Admitting: Internal Medicine

## 2021-09-26 NOTE — Progress Notes (Deleted)
? ?Name: Donna Mueller  ?MRN/ DOB: [id number], Feb 12, 1991    ?Age/ Sex: 31 y.o., female   ? ? ?PCP: Linda Hedges, DO   ?Reason for Endocrinology Evaluation: Hx of PTC  ?   ?Initial Endocrinology Clinic Visit: 09/16/2019  ? ? ?PATIENT IDENTIFIER: Donna Mueller is a 31 y.o., female with a past medical history of PTC. She has followed with [facility] since 09/16/2019 for consultative assistance with management of her Hx of PTC.  ? ?HISTORICAL SUMMARY:  ? ?Pt was diagnosed with thyroid papillary carcinoma in 2016. She is S/P total thyroidectomy in 07/2015, the tumor was 3.0 cm, 9/9 positive lymph nodes ( T2 N1a M0) . She is S/P RAI therapy for remnant ablation  10/2015.  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 Harlow Asa ?  ?In 10/2016 Thyrogen stimulated WBS showed no evidence of iodine -avid metastatic thyroid cancer  ?08/30/2018 PET and WBS negative  ? ?WBS negative 12/2020 ?  ? She is S/P NVD 05/27/2020 ( [place])  ?  ?  ?She had seen Dr. Buddy Duty, Chalmers Cater  And Dr. Denton Lank in the past ?Paternal grandmother - Hypothyroidism  ?SUBJECTIVE:  ? ? ?Today (09/26/2021):  Donna Mueller is here for a f/u on post-operative hypothyroidism secondary to PTC.  ? ?She has noticed decreased energy  ?Denies constipation or diarrhea  ?Has been having palpitations  ?Denies local neck swelling  ? ? ?She is currently on Levothyroxine 137 mcg , half a tablet on Sundays and 1 tablet the res of the week  ? ? ? ? ? ? ?HISTORY:  ?Past Medical History:  ?Past Medical History:  ?Diagnosis Date  ? ADD (attention deficit disorder)   ? Anxiety   ? Complication of anesthesia   ? Depression   ? Difficulty sleeping   ? GERD (gastroesophageal reflux disease)   ? Headache   ? HX MIGRAINES  ? Knee pain   ? LEFT  ? Papillary thyroid carcinoma ([place])   ? PONV (postoperative nausea and vomiting)   ? ?Past Surgical History:  ?Past Surgical History:  ?Procedure Laterality Date  ? LYMPH NODE  DISSECTION N/A 07/20/2015  ? Procedure: LYMPH NODE DISSECTION;  Surgeon: Armandina Gemma, MD;  Location: [facility];  Service: General;  Laterality: N/A;  ? THYROIDECTOMY N/A 07/20/2015  ? Procedure: TOTAL THYROIDECTOMY WITH LIMITED CENTRAL COMPARTMENT  LYMPH NODE DISSECTION  AND AUTO-TRANSPLANTATION OF LEFT INFERIOR PARATHYROID ;  Surgeon: Armandina Gemma, MD;  Location: [facility];  Service: General;  Laterality: N/A;  ? ?Social History:  reports that she has never smoked. She has never used smokeless tobacco. She reports that she does not currently use alcohol. She reports that she does not use drugs. ?Family History:  ?Family History  ?Problem Relation Age of Onset  ? Glaucoma Mother   ? Anxiety disorder Father   ? Healthy Brother   ? Thyroid disease Paternal Uncle   ? Lung cancer Maternal Grandmother   ? Diabetes Maternal Grandfather   ? ? ? ?HOME MEDICATIONS: ?Allergies as of 09/26/2021   ?No Known Allergies ?  ? ?  ?Medication List  ?  ? ?  ? Accurate as of September 26, 2021  7:37 AM. If you have any questions, ask your nurse or doctor.  ?  ?  ? ?  ? ?acetaminophen 500 MG tablet ?Commonly known as: TYLENOL ?Take 500 mg by mouth every 6 (six) hours as needed for mild pain. ?  ?  [place] FE 1/20 1-20 MG-MCG tablet ?Generic drug: norethindrone-ethinyl estradiol-FE ?Take 1 tablet by mouth daily. ?  ?buPROPion 150 MG 24 hr tablet ?Commonly known as: WELLBUTRIN XL ?Take 150 mg by mouth daily. ?  ?levothyroxine 137 MCG tablet ?Commonly known as: SYNTHROID ?Take 1 tablet (137 mcg total) by mouth daily before breakfast. ?  ?rifaximin 550 MG Tabs tablet ?Commonly known as: XIFAXAN ?Take 1 tablet (550 mg total) by mouth 3 (three) times daily. ?  ?rifaximin 550 MG Tabs tablet ?Commonly known as: XIFAXAN ?Take 1 tablet (550 mg total) by mouth 2 (two) times daily. ?  ?sertraline 25 MG tablet ?Commonly known as: ZOLOFT ?  ? ?  ? ? ? ? ?OBJECTIVE:  ? ?PHYSICAL EXAM: ?VS: There were no vitals taken for this visit.  ? ?EXAM: ?General: Pt appears well  and is in NAD  ?Neck: General: Supple without adenopathy. ?Thyroid: Thyroid is surgically removed  ?Lungs: Clear with good BS bilat with no rales, rhonchi, or wheezes  ?Heart: Auscultation: RRR.  ?Abdomen: Normoactive bowel sounds, soft, nontender, without masses or organomegaly palpable  ?Extremities:  ?BL LE: No pretibial edema normal ROM and strength.  ?Mental Status: Judgment, insight: Intact ?Orientation: Oriented to time, place, and person ? ?Mood and affect: No depression, anxiety, or agitation  ? ? ? ?DATA REVIEWED: ?Results for Donna Mueller, Donna Mueller (MRN [id number]) as of 03/31/2021 07:09 ? Ref. Range 03/28/2021 09:30  ?TSH Latest Ref Range: 0.35 - 5.50 uIU/mL 2.48  ?Thyroglobulin Antibody Latest Ref Range: 0.0 - 0.9 IU/mL 2.4 (H)  ? ? ? ? ?Thyroid ultrasound 04/04/2021 ? ?The thyroid gland is surgically absent. Stable appearance of 2 ?subcentimeter hypoechoic nodules in the right thyroid lobectomy bed ?measuring up to 0.6 x 0.5 cm and 0.4 x 0.3 cm, similar to previous ?exam. ?  ?In the right submandibular region, there are a couple of prominent ?lymph nodes identified, the largest of which measures up to 0.6 cm ?in cortical thickness/short axis. It appears to retain a normal ?fatty hilum and morphology. ?  ?IMPRESSION: ?1. Status post total thyroidectomy with stable appearance of 2 ?subcentimeter hypoechoic nodules in the right thyroid lobectomy bed, ?similar to previous exam. ?2. Prominent submandibular lymph node measures up to 0.6 cm in ?cortical thickness/short axis with preserved morphology, favored ?reactive. Attention on follow-up exam. ? ? ? ? ? ?WBS 12/17/2020 ? ?No foci of radiotracer activity within the thyroid bed. No evidence ?of distant metastatic disease on whole-body scan. ?  ?Physiologic activity noted in the salivary glands and GI tract. ?  ?IMPRESSION: ?No evidence of local thyroid cancer recurrence or metastatic disease ?by I 131 imaging. ?  ? ? ?ASSESSMENT / PLAN / RECOMMENDATIONS:  ? ?Hx of  Papillary Thyroid Carcinoma : S/P total thyroidectomy 2017 (T2 N1a M0)  ?  ?-Patient with excellent  biochemical response but indeterminate structural response to treatment, her last whole-body scan was done in 12/2020 with no evidence of iodine avid cancer,  ?-On her last thyroid bed ultrasound she was noted to have 6 mm right thyroid bed lymph node, will have low threshold for PET scan if TG elevated ?-We will repeat thyroid ultrasound ?-TSH goal 0.5- 2.0 uIU/mL  ? ? ? ?2 . Postoperative Hypothyroidism ?  ?-Patient is clinically euthyroid  ?-No local neck symptoms ?-TSH is slightly above goal, will not change at this time, will adjust if this remains at the same level in 8 weeks  ? ?  ?Medications ? ? levothyroxine 137 mcg daily  ? ?  ? ?  F/U in 6 months  ?Labs in 8 weeks  ? ?Signed electronically by: ?Abby Nena Jordan, MD ? ?[facility]  ?[facility] ?[address] ?[place], [place] [address] ?Phone: [phone] ?FAX: [phone]  ? ? ? ? ?CC: ?Morris, Jinny Blossom, DO ?[address], [place], [address] ?[place] [address] ?Phone: [phone]  ?Fax: [phone] ? ? ?Return to Endocrinology clinic as below: ?Future Appointments  ?Date Time Provider [place]  ?09/26/2021 10:10 AM Donna Mueller, Donna Crazier, MD [facility] None  ? ?  ? ?

## 2021-11-15 ENCOUNTER — Ambulatory Visit: Payer: Self-pay | Admitting: Surgery

## 2021-11-15 NOTE — H&P (Signed)
? ? ? ?REFERRING PHYSICIAN: Zehr, Laban Emperor, PA ? ?PROVIDER: Ramzi Brathwaite Charlotta Newton, MD ? ? ? ?Chief Complaint: New Consultation (Biliary dyskinesia, abdominal pain) ? ? ?History of Present Illness: ? ?Patient is referred by Dr. Zenovia Jarred and Alonza Bogus from [facility] for surgical evaluation and management of biliary dyskinesia and right upper quadrant abdominal pain. Patient had initially developed symptoms in April 2022, few months after the birth of her child. Patient experienced onset of nearly daily nausea and occasional emesis. She complains of right upper quadrant crampy abdominal pain. She has had frequent episodes of diarrhea. She has tried multiple medications and supplements without symptomatic improvement. Patient has been seen and evaluated by gastroenterology. She underwent an abdominal ultrasound on June 22, 2021. This was essentially a normal study. Patient underwent a nuclear medicine hepatobiliary scan on August 08, 2021. There was normal hepatic function and no evidence of ductal obstruction. However there was a reduced gallbladder ejection fraction of 20%. Patient is therefore referred for consideration for cholecystectomy for treatment of biliary dyskinesia. Patient has had no prior history of abdominal surgery. There is no family history of gallbladder disease. Patient denies any history of hepatobiliary or pancreatic disease. Patient is known to my practice from previous thyroid surgery. Patient works as a Theme park manager. ? ?Review of Systems: ?A complete review of systems was obtained from the patient. I have reviewed this information and discussed as appropriate with the patient. See HPI as well for other ROS. ? ?Review of Systems  ?Constitutional: Negative.  ?HENT: Negative.  ?Eyes: Negative.  ?Respiratory: Negative.  ?Cardiovascular: Negative.  ?Gastrointestinal: Positive for abdominal pain, diarrhea, heartburn, nausea and vomiting.  ?Genitourinary: Negative.  ?Musculoskeletal:  Negative.  ?Skin: Negative.  ?Neurological: Negative.  ?Endo/Heme/Allergies: Negative.  ?Psychiatric/Behavioral: Negative.  ? ? ?Medical History: ?Past Medical History:  ?Diagnosis Date  ? Anxiety  ? History of cancer  ? Thyroid disease  ? ?Patient Active Problem List  ?Diagnosis  ? Biliary dyskinesia  ? Abdominal pain, RUQ (right upper quadrant)  ? ?Past Surgical History:  ?Procedure Laterality Date  ? thyroid removal surgery N/A  ? ? ?Not on File ? ?Current Outpatient Medications on File Prior to Visit  ?Medication Sig Dispense Refill  ? levothyroxine (SYNTHROID) 125 MCG tablet  ? ?No current facility-administered medications on file prior to visit.  ? ?Family History  ?Problem Relation Age of Onset  ? High blood pressure (Hypertension) Father  ? ? ?Social History  ? ?Tobacco Use  ?Smoking Status Never  ?Smokeless Tobacco Never  ? ? ?Social History  ? ?Socioeconomic History  ? Marital status: Married  ?Tobacco Use  ? Smoking status: Never  ? Smokeless tobacco: Never  ?Vaping Use  ? Vaping Use: Never used  ?Substance and Sexual Activity  ? Alcohol use: Yes  ? Drug use: Never  ? ?Objective:  ? ?Vitals:  ?BP: 132/70  ?Pulse: (!) 118  ?Temp: 36.9 ?C (98.4 ?F)  ?SpO2: 98%  ?Weight: 55.9 kg (123 lb 3.2 oz)  ?Height: 157.5 cm ('5\' 2"'$ )  ? ?Body mass index is 22.53 kg/m?. ? ?Physical Exam  ? ?GENERAL APPEARANCE ?Development: normal ?Nutritional status: normal ?Gross deformities: none ? ?SKIN ?Rash, lesions, ulcers: none ?Induration, erythema: none ?Nodules: none palpable ? ?EYES ?Conjunctiva and lids: normal ?Pupils: equal and reactive ?Iris: normal bilaterally ? ?EARS, NOSE, MOUTH, THROAT ?External ears: no lesion or deformity ?External nose: no lesion or deformity ?Hearing: grossly normal ?Due to Covid-19 pandemic, patient is wearing a mask. ? ?NECK ?Symmetric:  yes ?Trachea: midline ?Thyroid: no palpable nodules in the thyroid bed; well-healed anterior cervical incision with good cosmetic result ? ?CHEST ?Respiratory  effort: normal ?Retraction or accessory muscle use: no ?Breath sounds: normal bilaterally ?Rales, rhonchi, wheeze: none ? ?CARDIOVASCULAR ?Auscultation: regular rhythm, normal rate ?Murmurs: none ?Pulses: radial pulse 2+ palpable ?Lower extremity edema: none ? ?ABDOMEN ?Distension: none ?Masses: none palpable ?Tenderness: none ?Hepatosplenomegaly: not present ?Hernia: not present ? ?MUSCULOSKELETAL ?Station and gait: normal ?Digits and nails: no clubbing or cyanosis ?Muscle strength: grossly normal all extremities ?Range of motion: grossly normal all extremities ?Deformity: none ? ?LYMPHATIC ?Cervical: none palpable ?Supraclavicular: none palpable ? ?PSYCHIATRIC ?Oriented to person, place, and time: yes ?Mood and affect: normal for situation ?Judgment and insight: appropriate for situation ? ?Assessment and Plan:  ? ?Biliary dyskinesia ? ?Abdominal pain, RUQ (right upper quadrant) ? ? ?Patient presents today with nearly a 1 year history of intermittent abdominal pain, nausea, occasional emesis, and intermittent diarrhea. Evaluation includes a nuclear medicine hepatobiliary scan indicating biliary dyskinesia. Patient has been evaluated by gastroenterology. She is now referred for consideration for cholecystectomy. ? ?Today we discussed proceeding with laparoscopic cholecystectomy with intraoperative cholangiography. We discussed the procedure. We discussed the size and location of the surgical incisions. We discussed performing intraoperative cholangiography. We discussed the small chance of conversion to open surgery. We discussed the likelihood of symptomatic improvement. The patient understands. We discussed the hospital stay and the postoperative recovery to be anticipated. The patient would like to proceed with surgery in the near future. ? ?Patient is provided with written literature on laparoscopic surgery to review at home. ? ?The risks and benefits of the procedure have been discussed at length with the  patient. The patient understands the proposed procedure, potential alternative treatments, and the course of recovery to be expected. All of the patient's questions have been answered at this time. The patient wishes to proceed with surgery.  ? ?Armandina Gemma, MD ?[facility] ?A [facility] practice ?Office: [phone] ? ? ?

## 2021-11-15 NOTE — Patient Instructions (Addendum)
DUE TO COVID-19 ONLY TWO VISITORS  (aged 28 and older)  ARE ALLOWED TO COME WITH YOU AND STAY IN THE WAITING ROOM ONLY DURING PRE OP AND PROCEDURE.   ? ?**NO VISITORS ARE ALLOWED IN THE SHORT STAY AREA OR RECOVERY ROOM!!** ? ?IF YOU WILL BE ADMITTED INTO THE HOSPITAL YOU ARE ALLOWED ONLY FOUR SUPPORT PEOPLE DURING VISITATION HOURS ONLY (7 AM -8PM)   ?The support person(s) must pass our screening, gel in and out, and wear a mask at all times, including in the patient?s room. ?Patients must also wear a mask when staff or their support person are in the room. ?Visitors GUEST BADGE MUST BE WORN VISIBLY  ?One adult visitor may remain with you overnight and MUST be in the room by 8 P.M. ?  ? ? Your procedure is scheduled on: 11/28/21 ? ? Report to [facility] Main Entrance ? ?  Report to short stay at 5:15 AM ? ? Call this number if you have problems the morning of surgery [phone] ? ? Do not eat food :After Midnight. ? ? After Midnight you may have the following liquids until _4:30 AM/  DAY OF SURGERY ? ?Water ?Black Coffee (sugar ok, NO MILK/CREAM OR CREAMERS)  ?Tea (sugar ok, NO MILK/CREAM OR CREAMERS) regular and decaf                             ?Plain Jell-O (NO RED)                                           ?Fruit ices (not with fruit pulp, NO RED)                                     ?Popsicles (NO RED)                                                                  ?Juice: apple, WHITE grape, WHITE cranberry ?Sports drinks like Gatorade (NO RED) ?Clear broth(vegetable,chicken,beef) ? ?           ?  ?  ?       If you have questions, please contact your surgeon?s office. ? ? ?  ?  ?Oral Hygiene is also important to reduce your risk of infection.                                    ?Remember - BRUSH YOUR TEETH THE MORNING OF SURGERY WITH YOUR REGULAR TOOTHPASTE ? ? Do NOT smoke after Midnight ? ? Take these medicines the morning of surgery with A SIP OF WATER: Sertraline, Rifaximin, Levothyroxine,  Bupropion ? ? ? ?                  ?           You may not have any metal on your body including hair pins, jewelry, and body piercing ? ?  Do not wear make-up, lotions, powders, perfumes/cologne, or deodorant ? ?Do not wear nail polish including gel and S&S, artificial/acrylic nails, or any other type of covering on natural nails including finger and toenails. If you have artificial nails, gel coating, etc. that needs to be removed by a nail salon please have this removed prior to surgery or surgery may need to be canceled/ delayed if the surgeon/ anesthesia feels like they are unable to be safely monitored.  ? ?Do not shave  48 hours prior to surgery.  ? ?             ? ? Do not bring valuables to the hospital. [facility] NOT ?            RESPONSIBLE   FOR VALUABLES. ? ? Contacts, dentures or bridgework may not be worn into surgery. ? ? Bring small overnight bag day of surgery. ?  ? Patients discharged on the day of surgery will not be allowed to drive home.  Someone NEEDS to stay with you for the first 24 hours after anesthesia. ? ? Special Instructions: Bring a copy of your healthcare power of attorney and living will documents the day of surgery if you haven't scanned them before. ? ?            Please read over the following fact sheets you were given: IF [place] [phone] ? ?   [facility] - Preparing for Surgery ?Before surgery, you can play an important role.  Because skin is not sterile, your skin needs to be as free of germs as possible.  You can reduce the number of germs on your skin by washing with CHG (chlorahexidine gluconate) soap before surgery.  CHG is an antiseptic cleaner which kills germs and bonds with the skin to continue killing germs even after washing. ?Please DO NOT use if you have an allergy to CHG or antibacterial soaps.  If your skin becomes reddened/irritated stop using the CHG and inform your nurse when you arrive  at Short Stay. ?Do not shave (including legs and underarms) for at least 48 hours prior to the first CHG shower.   ?Please follow these instructions carefully: ? 1.  Shower with CHG Soap the night before surgery and the  morning of Surgery. ? 2.  If you choose to wash your hair, wash your hair first as usual with your  normal  shampoo. ? 3.  After you shampoo, rinse your hair and body thoroughly to remove the  shampoo.                        ?    4.  Use CHG as you would any other liquid soap.  You can apply chg directly  to the skin and wash  ?                     Gently with a scrungie or clean washcloth. ? 5.  Apply the CHG Soap to your body ONLY FROM THE NECK DOWN.   Do not use on face/ open      ?                     Wound or open sores. Avoid contact with eyes, ears mouth and genitals (private parts).  ?  Wash face,  Genitals (private parts) with your normal soap. ?            6.  Wash thoroughly, paying special attention to the area where your surgery  will be performed. ? 7.  Thoroughly rinse your body with warm water from the neck down. ? 8.  DO NOT shower/wash with your normal soap after using and rinsing off  the CHG Soap. ?               9.  Pat yourself dry with a clean towel. ?           10.  Wear clean pajamas. ?           11.  Place clean sheets on your bed the night of your first shower and do not  sleep with pets. ?Day of Surgery : ?Do not apply any lotions/deodorants the morning of surgery.  Please wear clean clothes to the hospital/surgery center. ? ?FAILURE TO FOLLOW THESE INSTRUCTIONS MAY RESULT IN THE CANCELLATION OF YOUR SURGERY ? ? ? ?________________________________________________________________________  ?

## 2021-11-15 NOTE — Progress Notes (Signed)
Sent message, via epic in basket, requesting orders in epic from surgeon.  

## 2021-11-15 NOTE — H&P (View-Only) (Signed)
REFERRING PHYSICIAN: Zehr, Laban Emperor, PA  PROVIDER: Ellison Rieth Charlotta Newton, MD    Chief Complaint: New Consultation (Biliary dyskinesia, abdominal pain)   History of Present Illness:  Patient is referred by Dr. Zenovia Jarred and Alonza Bogus from [facility] for surgical evaluation and management of biliary dyskinesia and right upper quadrant abdominal pain. Patient had initially developed symptoms in April 2022, few months after the birth of her child. Patient experienced onset of nearly daily nausea and occasional emesis. She complains of right upper quadrant crampy abdominal pain. She has had frequent episodes of diarrhea. She has tried multiple medications and supplements without symptomatic improvement. Patient has been seen and evaluated by gastroenterology. She underwent an abdominal ultrasound on June 22, 2021. This was essentially a normal study. Patient underwent a nuclear medicine hepatobiliary scan on August 08, 2021. There was normal hepatic function and no evidence of ductal obstruction. However there was a reduced gallbladder ejection fraction of 20%. Patient is therefore referred for consideration for cholecystectomy for treatment of biliary dyskinesia. Patient has had no prior history of abdominal surgery. There is no family history of gallbladder disease. Patient denies any history of hepatobiliary or pancreatic disease. Patient is known to my practice from previous thyroid surgery. Patient works as a [employer] manager.  Review of Systems: A complete review of systems was obtained from the patient. I have reviewed this information and discussed as appropriate with the patient. See HPI as well for other ROS.  Review of Systems  Constitutional: Negative.  HENT: Negative.  Eyes: Negative.  Respiratory: Negative.  Cardiovascular: Negative.  Gastrointestinal: Positive for abdominal pain, diarrhea, heartburn, nausea and vomiting.  Genitourinary: Negative.  Musculoskeletal:  Negative.  Skin: Negative.  Neurological: Negative.  Endo/Heme/Allergies: Negative.  Psychiatric/Behavioral: Negative.    Medical History: Past Medical History:  Diagnosis Date   Anxiety   History of cancer   Thyroid disease   Patient Active Problem List  Diagnosis   Biliary dyskinesia   Abdominal pain, RUQ (right upper quadrant)   Past Surgical History:  Procedure Laterality Date   thyroid removal surgery N/A    Not on File  Current Outpatient Medications on File Prior to Visit  Medication Sig Dispense Refill   levothyroxine (SYNTHROID) 125 MCG tablet   No current facility-administered medications on file prior to visit.   Family History  Problem Relation Age of Onset   High blood pressure (Hypertension) Father    Social History   Tobacco Use  Smoking Status Never  Smokeless Tobacco Never    Social History   Socioeconomic History   Marital status: Married  Tobacco Use   Smoking status: Never   Smokeless tobacco: Never  Vaping Use   Vaping Use: Never used  Substance and Sexual Activity   Alcohol use: Yes   Drug use: Never   Objective:   Vitals:  BP: 132/70  Pulse: (!) 118  Temp: 36.9 C (98.4 F)  SpO2: 98%  Weight: 55.9 kg (123 lb 3.2 oz)  Height: 157.5 cm ('5\' 2"'$ )   Body mass index is 22.53 kg/m.  Physical Exam   GENERAL APPEARANCE Development: normal Nutritional status: normal Gross deformities: none  SKIN Rash, lesions, ulcers: none Induration, erythema: none Nodules: none palpable  EYES Conjunctiva and lids: normal Pupils: equal and reactive Iris: normal bilaterally  EARS, NOSE, MOUTH, THROAT External ears: no lesion or deformity External nose: no lesion or deformity Hearing: grossly normal Due to Covid-19 pandemic, patient is wearing a mask.  NECK Symmetric:  yes Trachea: midline Thyroid: no palpable nodules in the thyroid bed; well-healed anterior cervical incision with good cosmetic result  CHEST Respiratory  effort: normal Retraction or accessory muscle use: no Breath sounds: normal bilaterally Rales, rhonchi, wheeze: none  CARDIOVASCULAR Auscultation: regular rhythm, normal rate Murmurs: none Pulses: radial pulse 2+ palpable Lower extremity edema: none  ABDOMEN Distension: none Masses: none palpable Tenderness: none Hepatosplenomegaly: not present Hernia: not present  MUSCULOSKELETAL Station and gait: normal Digits and nails: no clubbing or cyanosis Muscle strength: grossly normal all extremities Range of motion: grossly normal all extremities Deformity: none  LYMPHATIC Cervical: none palpable Supraclavicular: none palpable  PSYCHIATRIC Oriented to person, place, and time: yes Mood and affect: normal for situation Judgment and insight: appropriate for situation  Assessment and Plan:   Biliary dyskinesia  Abdominal pain, RUQ (right upper quadrant)   Patient presents today with nearly a 1 year history of intermittent abdominal pain, nausea, occasional emesis, and intermittent diarrhea. Evaluation includes a nuclear medicine hepatobiliary scan indicating biliary dyskinesia. Patient has been evaluated by gastroenterology. She is now referred for consideration for cholecystectomy.  Today we discussed proceeding with laparoscopic cholecystectomy with intraoperative cholangiography. We discussed the procedure. We discussed the size and location of the surgical incisions. We discussed performing intraoperative cholangiography. We discussed the small chance of conversion to open surgery. We discussed the likelihood of symptomatic improvement. The patient understands. We discussed the hospital stay and the postoperative recovery to be anticipated. The patient would like to proceed with surgery in the near future.  Patient is provided with written literature on laparoscopic surgery to review at home.  The risks and benefits of the procedure have been discussed at length with the  patient. The patient understands the proposed procedure, potential alternative treatments, and the course of recovery to be expected. All of the patient's questions have been answered at this time. The patient wishes to proceed with surgery.   Armandina Gemma, MD [facility] [place] practice Office: [phone]

## 2021-11-21 ENCOUNTER — Other Ambulatory Visit: Payer: Self-pay

## 2021-11-21 ENCOUNTER — Encounter (HOSPITAL_COMMUNITY): Payer: Self-pay

## 2021-11-21 ENCOUNTER — Encounter (HOSPITAL_COMMUNITY)
Admission: RE | Admit: 2021-11-21 | Discharge: 2021-11-21 | Disposition: A | Discharge status: Home / Self Care | Payer: No Typology Code available for payment source | Source: Ambulatory Visit | Attending: Surgery | Admitting: Surgery

## 2021-11-21 VITALS — BP 116/86 | HR 70 | Temp 98.3°F | Resp 16 | Ht 62.0 in | Wt 118.0 lb

## 2021-11-21 DIAGNOSIS — Z01818 Encounter for other preprocedural examination: Secondary | ICD-10-CM

## 2021-11-21 DIAGNOSIS — Z01812 Encounter for preprocedural laboratory examination: Secondary | ICD-10-CM | POA: Diagnosis present

## 2021-11-21 LAB — CBC
HCT: 38.6 % (ref 36.0–46.0)
Hemoglobin: 12.6 g/dL (ref 12.0–15.0)
MCH: 30 pg (ref 26.0–34.0)
MCHC: 32.6 g/dL (ref 30.0–36.0)
MCV: 91.9 fL (ref 80.0–100.0)
Platelets: 295 10*3/uL (ref 150–400)
RBC: 4.2 MIL/uL (ref 3.87–5.11)
RDW: 12 % (ref 11.5–15.5)
WBC: 6.4 10*3/uL (ref 4.0–10.5)
nRBC: 0 % (ref 0.0–0.2)

## 2021-11-21 NOTE — Progress Notes (Signed)
Anesthesia note: ? ?Bowel prep reminder:no ? ?PCP - Linda Hedges DO ?Cardiologist -none ?Other-  ? ?Chest x-ray - no ?EKG - no ?Stress Test - no ?ECHO - no ?Cardiac Cath - no ? ?Pacemaker/ICD device last checked:NA ? ?Sleep Study - no ?CPAP -  ? ?Pt is pre diabetic-NA ?Fasting Blood Sugar -  ?Checks Blood Sugar _____ ? ?Blood Thinner:NA ?Blood Thinner Instructions: ?Aspirin Instructions: ?Last Dose: ? ?Anesthesia review: no ? ?Patient denies shortness of breath, fever, cough and chest pain at PAT appointment ?Pt has no SOB with any activities. ? ?Patient verbalized understanding of instructions that were given to them at the PAT appointment. Patient was also instructed that they will need to review over the PAT instructions again at home before surgery. yes ?

## 2021-11-27 ENCOUNTER — Encounter (HOSPITAL_COMMUNITY): Payer: Self-pay | Admitting: Surgery

## 2021-11-27 DIAGNOSIS — K828 Other specified diseases of gallbladder: Secondary | ICD-10-CM | POA: Diagnosis present

## 2021-11-27 DIAGNOSIS — R1011 Right upper quadrant pain: Secondary | ICD-10-CM | POA: Diagnosis present

## 2021-11-28 ENCOUNTER — Encounter (HOSPITAL_COMMUNITY): Payer: Self-pay | Admitting: Surgery

## 2021-11-28 ENCOUNTER — Other Ambulatory Visit: Payer: Self-pay

## 2021-11-28 ENCOUNTER — Ambulatory Visit (HOSPITAL_BASED_OUTPATIENT_CLINIC_OR_DEPARTMENT_OTHER): Payer: No Typology Code available for payment source | Admitting: Certified Registered Nurse Anesthetist

## 2021-11-28 ENCOUNTER — Ambulatory Visit (HOSPITAL_COMMUNITY): Payer: No Typology Code available for payment source | Admitting: Certified Registered Nurse Anesthetist

## 2021-11-28 ENCOUNTER — Encounter (HOSPITAL_COMMUNITY)
Admission: RE | Disposition: A | Discharge status: Home / Self Care | Payer: Self-pay | Source: Ambulatory Visit | Attending: Surgery

## 2021-11-28 ENCOUNTER — Ambulatory Visit (HOSPITAL_COMMUNITY): Payer: No Typology Code available for payment source

## 2021-11-28 ENCOUNTER — Ambulatory Visit (HOSPITAL_COMMUNITY)
Admission: RE | Admit: 2021-11-28 | Discharge: 2021-11-28 | Disposition: A | Discharge status: Home / Self Care | Payer: No Typology Code available for payment source | Source: Ambulatory Visit | Attending: Surgery | Admitting: Surgery

## 2021-11-28 DIAGNOSIS — E039 Hypothyroidism, unspecified: Secondary | ICD-10-CM | POA: Insufficient documentation

## 2021-11-28 DIAGNOSIS — K828 Other specified diseases of gallbladder: Secondary | ICD-10-CM | POA: Diagnosis not present

## 2021-11-28 DIAGNOSIS — K811 Chronic cholecystitis: Secondary | ICD-10-CM | POA: Insufficient documentation

## 2021-11-28 DIAGNOSIS — Z01818 Encounter for other preprocedural examination: Secondary | ICD-10-CM

## 2021-11-28 DIAGNOSIS — R1011 Right upper quadrant pain: Secondary | ICD-10-CM | POA: Diagnosis present

## 2021-11-28 HISTORY — PX: CHOLECYSTECTOMY: SHX55

## 2021-11-28 LAB — PREGNANCY, URINE: Preg Test, Ur: NEGATIVE

## 2021-11-28 SURGERY — LAPAROSCOPIC CHOLECYSTECTOMY
Anesthesia: General

## 2021-11-28 MED ORDER — SCOPOLAMINE 1 MG/3DAYS TD PT72
MEDICATED_PATCH | TRANSDERMAL | Status: AC
  Filled 2021-11-28: qty 1

## 2021-11-28 MED ORDER — ONDANSETRON HCL 4 MG/2ML IJ SOLN: 4.0000 mg | Freq: Once | INTRAMUSCULAR | Status: DC | PRN

## 2021-11-28 MED ORDER — DROPERIDOL 2.5 MG/ML IJ SOLN
INTRAMUSCULAR | Status: AC
  Filled 2021-11-28: qty 2

## 2021-11-28 MED ORDER — HYDROMORPHONE HCL 1 MG/ML IJ SOLN: 0.2500 mg | INTRAMUSCULAR | Status: DC | PRN

## 2021-11-28 MED ORDER — LIDOCAINE 2% (20 MG/ML) 5 ML SYRINGE
INTRAMUSCULAR | Status: DC | PRN
  Administered 2021-11-28: 60 mg via INTRAVENOUS

## 2021-11-28 MED ORDER — LACTATED RINGERS IV SOLN: INTRAVENOUS | Status: DC

## 2021-11-28 MED ORDER — PROPOFOL 10 MG/ML IV BOLUS
INTRAVENOUS | Status: AC
  Filled 2021-11-28: qty 20

## 2021-11-28 MED ORDER — BUPIVACAINE-EPINEPHRINE (PF) 0.5% -1:200000 IJ SOLN
INTRAMUSCULAR | Status: AC
  Filled 2021-11-28: qty 30

## 2021-11-28 MED ORDER — MIDAZOLAM HCL 2 MG/2ML IJ SOLN
INTRAMUSCULAR | Status: AC
  Filled 2021-11-28: qty 2

## 2021-11-28 MED ORDER — ONDANSETRON HCL 4 MG/2ML IJ SOLN
INTRAMUSCULAR | Status: AC
  Filled 2021-11-28: qty 2

## 2021-11-28 MED ORDER — BUPIVACAINE-EPINEPHRINE 0.5% -1:200000 IJ SOLN
INTRAMUSCULAR | Status: DC | PRN
  Administered 2021-11-28: 30 mL

## 2021-11-28 MED ORDER — DROPERIDOL 2.5 MG/ML IJ SOLN
INTRAMUSCULAR | Status: DC | PRN
  Administered 2021-11-28: .625 mg via INTRAVENOUS

## 2021-11-28 MED ORDER — OXYCODONE HCL 5 MG/5ML PO SOLN: 5.0000 mg | Freq: Once | ORAL | Status: AC | PRN

## 2021-11-28 MED ORDER — FENTANYL CITRATE (PF) 100 MCG/2ML IJ SOLN
INTRAMUSCULAR | Status: DC | PRN
  Administered 2021-11-28 (×2): 50 ug via INTRAVENOUS

## 2021-11-28 MED ORDER — FENTANYL CITRATE (PF) 250 MCG/5ML IJ SOLN
INTRAMUSCULAR | Status: AC
  Filled 2021-11-28: qty 5

## 2021-11-28 MED ORDER — OXYCODONE HCL 5 MG PO TABS
ORAL_TABLET | ORAL | Status: DC
Start: 2021-11-28 — End: 2021-11-28
  Filled 2021-11-28: qty 1

## 2021-11-28 MED ORDER — MIDAZOLAM HCL 5 MG/5ML IJ SOLN
INTRAMUSCULAR | Status: DC | PRN
  Administered 2021-11-28: 2 mg via INTRAVENOUS

## 2021-11-28 MED ORDER — OXYCODONE HCL 5 MG PO TABS
5.0000 mg | ORAL_TABLET | Freq: Once | ORAL | Status: AC | PRN
  Administered 2021-11-28: 5 mg via ORAL

## 2021-11-28 MED ORDER — KETOROLAC TROMETHAMINE 30 MG/ML IJ SOLN: 30.0000 mg | Freq: Once | INTRAMUSCULAR | Status: DC | PRN

## 2021-11-28 MED ORDER — CEFAZOLIN SODIUM-DEXTROSE 2-4 GM/100ML-% IV SOLN
INTRAVENOUS | Status: AC
  Filled 2021-11-28: qty 100

## 2021-11-28 MED ORDER — IOHEXOL 300 MG/ML  SOLN
INTRAMUSCULAR | Status: DC | PRN
  Administered 2021-11-28: 5 mL

## 2021-11-28 MED ORDER — CHLORHEXIDINE GLUCONATE CLOTH 2 % EX PADS: 6.0000 | MEDICATED_PAD | Freq: Once | CUTANEOUS | Status: DC

## 2021-11-28 MED ORDER — LIDOCAINE HCL (PF) 2 % IJ SOLN
INTRAMUSCULAR | Status: AC
  Filled 2021-11-28: qty 5

## 2021-11-28 MED ORDER — CHLORHEXIDINE GLUCONATE 0.12 % MT SOLN
15.0000 mL | Freq: Once | OROMUCOSAL | Status: AC
  Administered 2021-11-28: 15 mL via OROMUCOSAL

## 2021-11-28 MED ORDER — DEXAMETHASONE SODIUM PHOSPHATE 4 MG/ML IJ SOLN
INTRAMUSCULAR | Status: DC | PRN
  Administered 2021-11-28: 8 mg via INTRAVENOUS

## 2021-11-28 MED ORDER — ORAL CARE MOUTH RINSE: 15.0000 mL | Freq: Once | OROMUCOSAL | Status: AC

## 2021-11-28 MED ORDER — PROPOFOL 10 MG/ML IV BOLUS
INTRAVENOUS | Status: DC | PRN
  Administered 2021-11-28: 150 mg via INTRAVENOUS

## 2021-11-28 MED ORDER — KETOROLAC TROMETHAMINE 30 MG/ML IJ SOLN
INTRAMUSCULAR | Status: DC | PRN
  Administered 2021-11-28: 30 mg via INTRAVENOUS

## 2021-11-28 MED ORDER — ROCURONIUM BROMIDE 10 MG/ML (PF) SYRINGE
PREFILLED_SYRINGE | INTRAVENOUS | Status: AC
  Filled 2021-11-28: qty 10

## 2021-11-28 MED ORDER — DEXAMETHASONE SODIUM PHOSPHATE 10 MG/ML IJ SOLN
INTRAMUSCULAR | Status: AC
  Filled 2021-11-28: qty 1

## 2021-11-28 MED ORDER — PHENYLEPHRINE 80 MCG/ML (10ML) SYRINGE FOR IV PUSH (FOR BLOOD PRESSURE SUPPORT)
PREFILLED_SYRINGE | INTRAVENOUS | Status: AC
  Filled 2021-11-28: qty 10

## 2021-11-28 MED ORDER — TRAMADOL HCL 50 MG PO TABS: 50.0000 mg | ORAL_TABLET | Freq: Four times a day (QID) | ORAL | 0 refills | Status: DC | PRN

## 2021-11-28 MED ORDER — PHENYLEPHRINE 80 MCG/ML (10ML) SYRINGE FOR IV PUSH (FOR BLOOD PRESSURE SUPPORT)
PREFILLED_SYRINGE | INTRAVENOUS | Status: DC | PRN
  Administered 2021-11-28: 40 ug via INTRAVENOUS

## 2021-11-28 MED ORDER — SUGAMMADEX SODIUM 200 MG/2ML IV SOLN
INTRAVENOUS | Status: DC | PRN
  Administered 2021-11-28: 200 mg via INTRAVENOUS

## 2021-11-28 MED ORDER — KETOROLAC TROMETHAMINE 30 MG/ML IJ SOLN
INTRAMUSCULAR | Status: AC
  Filled 2021-11-28: qty 1

## 2021-11-28 MED ORDER — ONDANSETRON HCL 4 MG/2ML IJ SOLN
INTRAMUSCULAR | Status: DC | PRN
  Administered 2021-11-28: 4 mg via INTRAVENOUS

## 2021-11-28 MED ORDER — CEFAZOLIN SODIUM-DEXTROSE 2-4 GM/100ML-% IV SOLN
2.0000 g | INTRAVENOUS | Status: AC
  Administered 2021-11-28: 2 g via INTRAVENOUS

## 2021-11-28 MED ORDER — PROPOFOL 500 MG/50ML IV EMUL
INTRAVENOUS | Status: DC | PRN
  Administered 2021-11-28: 25 ug/kg/min via INTRAVENOUS

## 2021-11-28 SURGICAL SUPPLY — 42 items
APL PRP STRL LF DISP 70% ISPRP (MISCELLANEOUS) ×1
APPLIER CLIP ROT 10 11.4 M/L (STAPLE) ×2
APR CLP MED LRG 11.4X10 (STAPLE) ×1
BAG COUNTER SPONGE SURGICOUNT (BAG) IMPLANT
BAG RETRIEVAL 10 (BASKET) ×1
BAG SPNG CNTER NS LX DISP (BAG)
CABLE HIGH FREQUENCY MONO STRZ (ELECTRODE) ×2 IMPLANT
CHLORAPREP W/TINT 26 (MISCELLANEOUS) ×2 IMPLANT
CLIP APPLIE ROT 10 11.4 M/L (STAPLE) ×1 IMPLANT
COVER MAYO STAND STRL (DRAPES) IMPLANT
COVER SURGICAL LIGHT HANDLE (MISCELLANEOUS) ×2 IMPLANT
DRAPE C-ARM 42X120 X-RAY (DRAPES) IMPLANT
ELECT REM PT RETURN 15FT ADLT (MISCELLANEOUS) ×2 IMPLANT
GLOVE SURG ORTHO 8.0 STRL STRW (GLOVE) ×2 IMPLANT
GLOVE SURG SYN 7.5  E (GLOVE) ×2
GLOVE SURG SYN 7.5 E (GLOVE) ×1 IMPLANT
GLOVE SURG SYN 7.5 PF PI (GLOVE) ×1 IMPLANT
GOWN STRL REUS W/ TWL XL LVL3 (GOWN DISPOSABLE) ×2 IMPLANT
GOWN STRL REUS W/TWL XL LVL3 (GOWN DISPOSABLE) ×4
HEMOSTAT SURGICEL 4X8 (HEMOSTASIS) IMPLANT
IRRIG SUCT STRYKERFLOW 2 WTIP (MISCELLANEOUS) ×2
IRRIGATION SUCT STRKRFLW 2 WTP (MISCELLANEOUS) ×1 IMPLANT
KIT BASIN OR (CUSTOM PROCEDURE TRAY) ×2 IMPLANT
KIT TURNOVER KIT A (KITS) IMPLANT
PAD POSITIONING PINK XL (MISCELLANEOUS) IMPLANT
PENCIL SMOKE EVACUATOR (MISCELLANEOUS) IMPLANT
SCISSORS LAP 5X35 DISP (ENDOMECHANICALS) ×2 IMPLANT
SET CHOLANGIOGRAPH MIX (MISCELLANEOUS) ×2 IMPLANT
SET TUBE SMOKE EVAC HIGH FLOW (TUBING) ×2 IMPLANT
SLEEVE Z-THREAD 5X100MM (TROCAR) ×2 IMPLANT
SPIKE FLUID TRANSFER (MISCELLANEOUS) ×2 IMPLANT
STRIP CLOSURE SKIN 1/2X4 (GAUZE/BANDAGES/DRESSINGS) ×2 IMPLANT
SUT VIC AB 4-0 PS2 27 (SUTURE) ×2 IMPLANT
SYS BAG RETRIEVAL 10MM (BASKET) ×1
SYSTEM BAG RETRIEVAL 10MM (BASKET) ×1 IMPLANT
TAPE CLOTH 4X10 WHT NS (GAUZE/BANDAGES/DRESSINGS) IMPLANT
TOWEL OR 17X26 10 PK STRL BLUE (TOWEL DISPOSABLE) ×2 IMPLANT
TRAY LAPAROSCOPIC (CUSTOM PROCEDURE TRAY) ×2 IMPLANT
TROCAR BALLN 12MMX100 BLUNT (TROCAR) ×2 IMPLANT
TROCAR BLADELESS OPT 5 100 (ENDOMECHANICALS) ×2 IMPLANT
TROCAR XCEL NON-BLD 11X100MML (ENDOMECHANICALS) ×2 IMPLANT
TROCAR Z-THREAD FIOS 11X100 BL (TROCAR) ×1 IMPLANT

## 2021-11-28 NOTE — Anesthesia Procedure Notes (Addendum)
Procedure Name: Intubation Date/Time: 11/28/2021 7:29 AM Performed by: Deliah Boston, CRNA Pre-anesthesia Checklist: Patient identified, Emergency Drugs available, Suction available and Patient being monitored Patient Re-evaluated:Patient Re-evaluated prior to induction Oxygen Delivery Method: Circle system utilized Preoxygenation: Pre-oxygenation with 100% oxygen Induction Type: IV induction Ventilation: Mask ventilation without difficulty Laryngoscope Size: Mac and 3 Grade View: Grade I Tube type: Oral Tube size: 7.0 mm Number of attempts: 1 Airway Equipment and Method: Stylet and Oral airway Placement Confirmation: ETT inserted through vocal cords under direct vision, positive ETCO2 and breath sounds checked- equal and bilateral Secured at: 21 cm Tube secured with: Tape Dental Injury: Teeth and Oropharynx as per pre-operative assessment and Injury to lip  Comments: Small nick to upper lip noted

## 2021-11-28 NOTE — Anesthesia Preprocedure Evaluation (Signed)
Anesthesia Evaluation  Patient identified by MRN, date of birth, ID band Patient awake    Reviewed: Allergy & Precautions, NPO status , Patient's Chart, lab work & pertinent test results  History of Anesthesia Complications (+) PONV and history of anesthetic complications  Airway Mallampati: II  TM Distance: >3 FB Neck ROM: Full    Dental no notable dental hx.    Pulmonary neg pulmonary ROS,    Pulmonary exam normal breath sounds clear to auscultation       Cardiovascular negative cardio ROS Normal cardiovascular exam Rhythm:Regular Rate:Normal     Neuro/Psych negative neurological ROS  negative psych ROS   GI/Hepatic Neg liver ROS, GERD  ,  Endo/Other  Hypothyroidism   Renal/GU negative Renal ROS  negative genitourinary   Musculoskeletal negative musculoskeletal ROS (+)   Abdominal   Peds negative pediatric ROS (+)  Hematology negative hematology ROS (+)   Anesthesia Other Findings   Reproductive/Obstetrics negative OB ROS                             Anesthesia Physical Anesthesia Plan  ASA: 2  Anesthesia Plan: General   Post-op Pain Management:    Induction: Intravenous  PONV Risk Score and Plan: 4 or greater and Ondansetron, Dexamethasone, Droperidol, Midazolam, Scopolamine patch - Pre-op and Treatment may vary due to age or medical condition  Airway Management Planned: Oral ETT  Additional Equipment:   Intra-op Plan:   Post-operative Plan: Extubation in OR  Informed Consent: I have reviewed the patients History and Physical, chart, labs and discussed the procedure including the risks, benefits and alternatives for the proposed anesthesia with the patient or authorized representative who has indicated his/her understanding and acceptance.     Dental advisory given  Plan Discussed with: CRNA and Surgeon  Anesthesia Plan Comments:         Anesthesia Quick  Evaluation

## 2021-11-28 NOTE — Interval H&P Note (Signed)
History and Physical Interval Note:  11/28/2021 7:04 AM  Donna Mueller  has presented today for surgery, with the diagnosis of BILIARY DYSKINESIA, ABDOMINAL PAIN RIGHT UPPER QUADRANT.  The various methods of treatment have been discussed with the patient and family. After consideration of risks, benefits and other options for treatment, the patient has consented to    Procedure(s): LAPAROSCOPIC CHOLECYSTECTOMY WITH INTRAOPERATIVE CHOLANGIOGRAM (N/A) as a surgical intervention.    The patient's history has been reviewed, patient examined, no change in status, stable for surgery.  I have reviewed the patient's chart and labs.  Questions were answered to the patient's satisfaction.    Armandina Gemma, [facility] [place] practice Office: [place]

## 2021-11-28 NOTE — Transfer of Care (Signed)
Immediate Anesthesia Transfer of Care Note  Patient: Donna Mueller  Procedure(s) Performed: Procedure(s): LAPAROSCOPIC CHOLECYSTECTOMY WITH INTRAOPERATIVE CHOLANGIOGRAM (N/A)  Patient Location: PACU  Anesthesia Type:General  Level of Consciousness: Patient easily awoken, sedated, comfortable, cooperative, following commands, responds to stimulation.   Airway & Oxygen Therapy: Patient spontaneously breathing, ventilating well, oxygen via simple oxygen mask.  Post-op Assessment: Report given to PACU RN, vital signs reviewed and stable, moving all extremities.   Post vital signs: Reviewed and stable.  Complications: No apparent anesthesia complications Last Vitals:  Vitals Value Taken Time  BP 143/131 11/28/21 0843  Temp    Pulse 83 11/28/21 0844  Resp 17 11/28/21 0844  SpO2 100 % 11/28/21 0844  Vitals shown include unvalidated device data.  Last Pain:  Vitals:   11/28/21 0616  TempSrc:   PainSc: 0-No pain         Complications: No notable events documented.

## 2021-11-28 NOTE — Anesthesia Postprocedure Evaluation (Signed)
Anesthesia Post Note  Patient: Donna Mueller  Procedure(s) Performed: LAPAROSCOPIC CHOLECYSTECTOMY WITH INTRAOPERATIVE CHOLANGIOGRAM     Patient location during evaluation: PACU Anesthesia Type: General Level of consciousness: awake and alert Pain management: pain level controlled Vital Signs Assessment: post-procedure vital signs reviewed and stable Respiratory status: spontaneous breathing, nonlabored ventilation, respiratory function stable and patient connected to nasal cannula oxygen Cardiovascular status: blood pressure returned to baseline and stable Postop Assessment: no apparent nausea or vomiting Anesthetic complications: no   No notable events documented.  Last Vitals:  Vitals:   11/28/21 0915 11/28/21 0930  BP: 109/75 114/76  Pulse: 72 82  Resp: 15 16  Temp:    SpO2: 96% 100%    Last Pain:  Vitals:   11/28/21 0930  TempSrc:   PainSc: 0-No pain                 Neila Teem S

## 2021-11-28 NOTE — Discharge Instructions (Signed)
[facility]  LAPAROSCOPIC SURGERY:  POST-OP INSTRUCTIONS  Always review your discharge instruction sheet given to you by the facility where your surgery was performed.  A prescription for pain medication may be given to you upon discharge.  Take your pain medication as prescribed.  If narcotic pain medicine is not needed, then you may take acetaminophen (Tylenol) or ibuprofen (Advil) as needed.  Take your usually prescribed medications unless otherwise directed.  If you need a refill on your pain medication, please contact your pharmacy.  They will contact our office to request authorization. Prescriptions will not be filled after 5 P.M. or on weekends.  You should follow a light diet the first few days after arrival home, such as soup and crackers or toast.  Be sure to include plenty of fluids daily.  Most patients will experience some swelling and bruising in the area of the incisions.  Ice packs will help.  Swelling and bruising can take several days to resolve.   It is common to experience some constipation after surgery.  Increasing fluid intake and taking a stool softener (such as Colace) will usually help or prevent this problem from occurring.  A mild laxative (Milk of Magnesia or Miralax) should be taken according to package instructions if there has been no bowel movement after 48 hours.  You will likely have Dermabond (topical glue) over your incisions.  This seals the incisions and allows you to bathe and shower at any time after your surgery.  Glue should remain in place for up to 10 days.  It may be removed after 10 days by pealing off the Dermabond material or using Vaseline or naval jelly to remove.  If you have steri-strips over your incisions, you may remove the gauze bandage on the second day after surgery, and you may shower at that time.  Leave your steri-strips (small skin tapes) in place directly over the incision.  These strips should remain on the  skin for 5-7 days and then be removed.  You may get them wet in the shower and pat them dry.  Any sutures or staples will be removed at the office during your follow-up visit.  ACTIVITIES:  You may resume regular (light) daily activities beginning the next day - such as daily self-care, walking, climbing stairs - gradually increasing activities as tolerated.  You may have sexual intercourse when it is comfortable.  Refrain from any heavy lifting or straining until approved by your doctor.  You may drive when you are no longer taking prescription pain medication, when you can comfortably wear a seatbelt, and when you can safely maneuver your car and apply brakes.  You should see your doctor in the office for a follow-up appointment approximately 2-3 weeks after your surgery.  Make sure that you call for this appointment within a day or two after you arrive home to insure a convenient appointment time.  WHEN TO CALL YOUR DOCTOR: Fever over 101.0 Inability to urinate Continued bleeding from incision Increased pain, redness, or drainage from the incision Increasing abdominal pain  The clinic staff is available to answer your questions during regular business hours.  Please don't hesitate to call and ask to speak to one of the nurses for clinical concerns.  If you have a medical emergency, go to the nearest emergency room or call 911.  A surgeon from [facility] is always on call for the hospital.  Shameika Speelman, MD [facility] Office: [phone] Toll Free:    [phone] FAX [phone]  Website: [email or web address] 

## 2021-11-28 NOTE — Op Note (Signed)
Procedure Note  Pre-operative Diagnosis:  biliary dyskinesia, abdominal pain  Post-operative Diagnosis:  same  Surgeon:  Armandina Gemma, MD  Assistant:  none   Procedure:  Laparoscopic cholecystectomy with intra-operative cholangiography  Anesthesia:  General  Estimated Blood Loss:  minimal  Drains: none         Specimen: gallbladder to pathology  Indications:  Patient is referred by Dr. Zenovia Jarred and Alonza Bogus from [facility] for surgical evaluation and management of biliary dyskinesia and right upper quadrant abdominal pain. Patient had initially developed symptoms in April 2022, few months after the birth of her child. Patient experienced onset of nearly daily nausea and occasional emesis. She complains of right upper quadrant crampy abdominal pain. She has had frequent episodes of diarrhea. She has tried multiple medications and supplements without symptomatic improvement. Patient has been seen and evaluated by gastroenterology. She underwent an abdominal ultrasound on June 22, 2021. This was essentially a normal study. Patient underwent a nuclear medicine hepatobiliary scan on August 08, 2021. There was normal hepatic function and no evidence of ductal obstruction. However there was a reduced gallbladder ejection fraction of 20%. Patient is therefore referred for consideration for cholecystectomy for treatment of biliary dyskinesia.   Procedure description: The patient was seen in the pre-op holding area. The risks, benefits, complications, treatment options, and expected outcomes were previously discussed with the patient. The patient agreed with the proposed plan and has signed the informed consent form.  The patient was transported to operating room #1 at the [facility]. The patient was placed in the supine position on the operating room table. Following induction of general anesthesia, the abdomen was prepped and draped in the usual aseptic fashion.  An incision was  made in the skin near the umbilicus. The midline fascia was incised and the peritoneal cavity was entered and a Hasson cannula was introduced under direct vision. The cannula was secured with a 0-Vicryl pursestring suture. Pneumoperitoneum was established with carbon dioxide. Additional cannulae were introduced under direct vision along the right costal margin in the midline, mid-clavicular line, and anterior axillary line.   The gallbladder was identified and the fundus grasped and retracted cephalad. Adhesions were taken down bluntly and the electrocautery was utilized as needed, taking care not to involve any adjacent structures. The infundibulum was grasped and retracted laterally, exposing the peritoneum overlying the triangle of Calot. The peritoneum was incised and structures exposed with blunt dissection. The cystic duct was clearly identified, bluntly dissected circumferentially, and clipped at the neck of the gallbladder.  An incision was made in the cystic duct and the cholangiogram catheter introduced. The catheter was secured using an ligaclip.  Real-time cholangiography was performed using C-arm fluoroscopy.  There was rapid filling of a normal caliber common bile duct.  There was reflux of contrast into the left and right hepatic ductal systems.  There was free flow distally into the duodenum without filling defect or obstruction.  The catheter was removed from the peritoneal cavity.  The cystic duct was then ligated with ligaclips and divided. The cystic artery was identified, dissected circumferentially, ligated with ligaclips, and divided.  The gallbladder was dissected away from the gallbladder bed using the electrocautery for hemostasis. The gallbladder was completely removed from the liver and placed into an endocatch bag. The gallbladder was removed in the endocatch bag through the umbilical port site and submitted to pathology for review.  The right upper quadrant was irrigated and  the gallbladder bed was inspected.  Hemostasis was achieved with the electrocautery.  Cannulae were removed under direct vision and good hemostasis was noted. Pneumoperitoneum was released and the majority of the carbon dioxide evacuated. The umbilical wound was irrigated and the fascia was then closed with the pursestring suture.  Local anesthetic was infiltrated at all port sites. Skin incisions were closed with 4-0 Monocril subcuticular sutures and Dermabond was applied.  Instrument, sponge, and needle counts were correct at the conclusion of the case.  The patient was awakened from anesthesia and brought to the recovery room in stable condition.  The patient tolerated the procedure well.   Armandina Gemma, MD [facility] Office: [phone]

## 2021-11-29 ENCOUNTER — Encounter (HOSPITAL_COMMUNITY): Payer: Self-pay | Admitting: Surgery

## 2021-11-29 LAB — SURGICAL PATHOLOGY

## 2021-12-21 ENCOUNTER — Other Ambulatory Visit (HOSPITAL_COMMUNITY): Payer: Self-pay | Admitting: Surgery

## 2021-12-21 ENCOUNTER — Other Ambulatory Visit: Payer: Self-pay | Admitting: Surgery

## 2021-12-21 DIAGNOSIS — G8918 Other acute postprocedural pain: Secondary | ICD-10-CM

## 2021-12-22 ENCOUNTER — Ambulatory Visit (HOSPITAL_COMMUNITY)
Admission: RE | Admit: 2021-12-22 | Discharge: 2021-12-22 | Disposition: A | Discharge status: Home / Self Care | Payer: No Typology Code available for payment source | Source: Ambulatory Visit | Attending: Surgery | Admitting: Surgery

## 2021-12-22 DIAGNOSIS — G8918 Other acute postprocedural pain: Secondary | ICD-10-CM | POA: Insufficient documentation

## 2022-01-13 NOTE — Telephone Encounter (Signed)
As of today, patient still has not picked up xifaxan samples. I have removed xifaxan from front desk for patient pick up and have placed back in sample closet.

## 2023-11-12 ENCOUNTER — Encounter: Payer: Self-pay | Admitting: Surgery

## 2024-08-04 ENCOUNTER — Ambulatory Visit: Admitting: Physician Assistant

## 2024-08-11 ENCOUNTER — Ambulatory Visit: Payer: Self-pay | Admitting: Physician Assistant

## 2024-08-13 ENCOUNTER — Ambulatory Visit: Payer: Self-pay | Admitting: Physician Assistant

## 2024-08-13 ENCOUNTER — Encounter: Payer: Self-pay | Admitting: Physician Assistant

## 2024-08-13 VITALS — BP 122/70 | HR 78 | Wt 128.8 lb

## 2024-08-13 DIAGNOSIS — F331 Major depressive disorder, recurrent, moderate: Secondary | ICD-10-CM | POA: Diagnosis not present

## 2024-08-13 DIAGNOSIS — E890 Postprocedural hypothyroidism: Secondary | ICD-10-CM

## 2024-08-13 DIAGNOSIS — Z7689 Persons encountering health services in other specified circumstances: Secondary | ICD-10-CM

## 2024-08-13 DIAGNOSIS — F411 Generalized anxiety disorder: Secondary | ICD-10-CM | POA: Diagnosis not present

## 2024-08-13 DIAGNOSIS — R4589 Other symptoms and signs involving emotional state: Secondary | ICD-10-CM | POA: Diagnosis not present

## 2024-08-13 DIAGNOSIS — C73 Malignant neoplasm of thyroid gland: Secondary | ICD-10-CM

## 2024-08-13 DIAGNOSIS — Z0000 Encounter for general adult medical examination without abnormal findings: Secondary | ICD-10-CM

## 2024-08-13 DIAGNOSIS — E0500 Thyrotoxicosis with diffuse goiter without thyrotoxic crisis or storm: Secondary | ICD-10-CM | POA: Diagnosis not present

## 2024-08-13 MED ORDER — VORTIOXETINE HBR 5 MG PO TABS: 5.0000 mg | ORAL_TABLET | Freq: Every day | ORAL | 1 refills | Status: AC

## 2024-08-13 MED ORDER — CLONAZEPAM 0.5 MG PO TABS: 0.5000 mg | ORAL_TABLET | Freq: Two times a day (BID) | ORAL | 0 refills | Status: AC | PRN

## 2024-08-13 NOTE — Patient Instructions (Addendum)
 Start trintellix  daily and klonapin as needed up to twice a day.   Vortioxetine  Tablets What is this medication? Vortioxetine  (vor tee OX e teen) treats depression and anxiety. It increases the amount of serotonin in the brain, a substance that helps regulate mood. This medicine may be used for other purposes; ask your health care provider or pharmacist if you have questions. COMMON BRAND NAME(S): BRINTELLIX, Trintellix  What should I tell my care team before I take this medication? They need to know if you have any of these conditions: Bipolar disorder or a family history of bipolar disorder Bleeding disorder Glaucoma Liver disease Low levels of sodium in the blood Seizures Suicidal thoughts, plans, or attempt by you or a family member Take medications that treat or prevent blood clots Taken an MAOI, such as Marplan, Nardil, or Parnate in the last 14 days An unusual or allergic reaction to vortioxetine , other medications, foods, dyes, or preservatives Pregnant or trying to get pregnant Breastfeeding How should I use this medication? Take this medication by mouth with water . Take it as directed on the prescription label at the same time every day. You can take it with or without food. If it upsets your stomach, take it with food. Keep taking it unless your care team tells you to stop. A special MedGuide will be given to you by the pharmacist with each prescription and refill. Be sure to read this information carefully each time. Talk to your care team about the use of this medication in children. Special care may be needed. Overdosage: If you think you have taken too much of this medicine contact a poison control center or emergency room at once. NOTE: This medicine is only for you. Do not share this medicine with others. What if I miss a dose? If you miss a dose, take it as soon as you can. If it is almost time for your next dose, take only that dose. Do not take double or extra  doses. What may interact with this medication? Do not take this medication with any of the following: Linezolid MAOIs, such as Marplan, Nardil, and Parnate Methylene blue (injected into a vein) This medication may also interact with the following: Aspirin and aspirin-like medications Certain medications for depression, anxiety, or other mental health conditions Certain medications for migraines, such as sumatriptan Certain medications for seizures Diuretics Lithium Medications that treat or prevent blood clots, such as warfarin NSAIDs, medications for pain and inflammation, such as ibuprofen  or naproxen  Opioids Quinidine St. John's wort Stimulant medications for ADHD, weight loss, or staying awake Tryptophan This list may not describe all possible interactions. Give your health care provider a list of all the medicines, herbs, non-prescription drugs, or dietary supplements you use. Also tell them if you smoke, drink alcohol, or use illegal drugs. Some items may interact with your medicine. What should I watch for while using this medication? Visit your care team for regular checks on your progress. Tell your care team if your symptoms do not start to get better or if they get worse. This medication may cause thoughts of suicide or depression. This includes sudden changes in mood, behaviors, or thoughts. These changes can happen at any time but are more common in the beginning of treatment or after a change in dose. Call your care team right away if you experience these thoughts or worsening depression. This medication may affect your coordination, reaction time, or judgment. Do not drive or operate machinery until you know how this medication  affects you. Sit up or stand slowly to reduce the risk of dizzy or fainting spells. Drinking alcohol with this medication can increase the risk of these side effects. What side effects may I notice from receiving this medication? Side effects that you  should report to your care team as soon as possible: Allergic reactions--skin rash, itching, hives, swelling of the face, lips, tongue, or throat Bleeding--bloody or black, tar-like stools, vomiting blood or brown material that looks like coffee grounds, red or dark brown urine, small red or purple spots on skin, unusual bruising or bleeding Irritability, confusion, fast or irregular heartbeat, muscle stiffness, twitching muscles, sweating, high fever, seizure, chills, vomiting, diarrhea, which may be signs of serotonin syndrome Low sodium level--muscle weakness, fatigue, dizziness, headache, confusion Sudden eye pain or change in vision such as blurry vision, seeing halos around lights, vision loss Thoughts of suicide or self-harm, worsening mood, feelings of depression Side effects that usually do not require medical attention (report to your care team if they continue or are bothersome): Change in sex drive or performance Constipation Nausea Vomiting This list may not describe all possible side effects. Call your doctor for medical advice about side effects. You may report side effects to FDA at 1-800-FDA-1088. Where should I keep my medication? Keep out of the reach of children and pets. Store at room temperature between 20 and 25 degrees C (68 and 77 degrees F). Throw away any unused medication after the expiration date. NOTE: This sheet is a summary. It may not cover all possible information. If you have questions about this medicine, talk to your doctor, pharmacist, or health care provider.  2024 Elsevier/Gold Standard (2022-10-04 00:00:00)

## 2024-08-13 NOTE — Progress Notes (Unsigned)
 "  New Patient Office Visit  Subjective    Patient ID: Donna Mueller, female    DOB: 1991/03/27  Age: 34 y.o. MRN: [id number]  CC:  Chief Complaint  Patient presents with   Establish Care    HPI .Discussed the use of AI scribe software for clinical note transcription with the patient, who gave verbal consent to proceed.  History of Present Illness Donna Mueller is a 34 year old female with thyroid  cancer and Graves' disease who presents with anxiety and mood disturbances.  Thyroid  cancer and graves' disease - Diagnosed with thyroid  cancer and Graves' disease. - Underwent total thyroidectomy; currently on Synthroid  (levothyroxine ) for thyroid  hormone replacement. - Has not maintained a consistent dose of thyroid  medication for the past nine years. - Underwent neck dissection in August of the previous year due to cancer recurrence. - Currently managed by an oncologist and endocrine surgeon at [facility]:   Thyroid  hormone replacement therapy - Started T3 (Vitafol) in addition to Synthroid  in November. - Developed severe panic attacks and felt 'out of mind' after starting T3. - Discontinued T3 after blood work in late December showed absorption; experienced 'crazy' feelings while on T3. - Since stopping T3, panic attacks have lessened but significant anxiety and mood disturbances persist.  Anxiety and mood disturbances - Experiences significant anxiety, passive depression, mood swings, and episodes of 'strange rage.' - Symptoms worsened after discontinuing anxiety medications in May or June of the previous year. - Describes the summer as having 'lots of anxiety' and 'lots of passive depression.' - No current use of anxiety or depression medications; expresses frustration with previous medication trials.  Psychotropic medication history - Previously tried Prozac, Wellbutrin , and Zoloft  for anxiety and depression. - Wellbutrin  initially effective but later contributed to 'ragey'  feelings. - Discontinued anxiety medications due to perceived ineffectiveness and side effects. - Currently not taking any psychotropic medications.  Psychosocial factors - Engaged in therapy for mood and anxiety management. - Has a supportive husband who reassures her. - Mother to a four-year-old son; concerned about mood affecting parenting, particularly 'random ragey outbursts.'    Outpatient Encounter Medications as of 08/13/2024  Medication Sig   BLISOVI  FE 1/20 1-20 MG-MCG tablet Take 1 tablet by mouth daily.   clonazePAM  (KLONOPIN ) 0.5 MG tablet Take 1 tablet (0.5 mg total) by mouth 2 (two) times daily as needed for anxiety.   levothyroxine  (SYNTHROID ) 137 MCG tablet Take 1 tablet (137 mcg total) by mouth daily before breakfast.   vortioxetine  HBr (TRINTELLIX ) 5 MG TABS tablet Take 1 tablet (5 mg total) by mouth daily.   [DISCONTINUED] rifaximin  (XIFAXAN ) 550 MG TABS tablet Take 1 tablet (550 mg total) by mouth 3 (three) times daily. (Patient not taking: Reported on 08/13/2024)   [DISCONTINUED] rifaximin  (XIFAXAN ) 550 MG TABS tablet Take 1 tablet (550 mg total) by mouth 2 (two) times daily. (Patient not taking: Reported on 08/13/2024)   [DISCONTINUED] traMADol  (ULTRAM ) 50 MG tablet Take 1-2 tablets (50-100 mg total) by mouth every 6 (six) hours as needed for moderate pain.   No facility-administered encounter medications on file as of 08/13/2024.    Past Medical History:  Diagnosis Date   ADD (attention deficit disorder)    Anxiety    Complication of anesthesia    Depression    Difficulty sleeping    GERD (gastroesophageal reflux disease)    Headache    HX MIGRAINES   Knee pain    LEFT   Papillary thyroid  carcinoma (HCC)  PONV (postoperative nausea and vomiting)     Past Surgical History:  Procedure Laterality Date   CHOLECYSTECTOMY N/A 11/28/2021   Procedure: LAPAROSCOPIC CHOLECYSTECTOMY WITH INTRAOPERATIVE CHOLANGIOGRAM;  Surgeon: Eletha Boas, MD;  Location: [facility];   Service: General;  Laterality: N/A;   LYMPH NODE DISSECTION N/A 07/20/2015   Procedure: LYMPH NODE DISSECTION;  Surgeon: Boas Eletha, MD;  Location: [facility];  Service: General;  Laterality: N/A;   THYROIDECTOMY N/A 07/20/2015   Procedure: TOTAL THYROIDECTOMY WITH LIMITED CENTRAL COMPARTMENT  LYMPH NODE DISSECTION  AND AUTO-TRANSPLANTATION OF LEFT INFERIOR PARATHYROID  ;  Surgeon: Boas Eletha, MD;  Location: [facility];  Service: General;  Laterality: N/A;    Family History  Problem Relation Age of Onset   Glaucoma Mother    Anxiety disorder Father    Healthy Brother    Thyroid  disease Paternal Uncle    Lung cancer Maternal Grandmother    Diabetes Maternal Grandfather     Social History   Socioeconomic History   Marital status: Married    Spouse name: Georgia   Number of children: 0   Years of education: Not on file   Highest education level: Not on file  Occupational History   Occupation: Hair stylist, [employer]  Tobacco Use   Smoking status: Never   Smokeless tobacco: Never  Vaping Use   Vaping status: Never Used  Substance and Sexual Activity   Alcohol use: Not Currently    Comment: OCCASIONAL   Drug use: No   Sexual activity: Yes    Birth control/protection: Pill  Other Topics Concern   Not on file  Social History Narrative   Not on file   Social Drivers of Health   Tobacco Use: Low Risk (08/13/2024)   Patient History    Smoking Tobacco Use: Never    Smokeless Tobacco Use: Never    Passive Exposure: Not on file  Financial Resource Strain: Low Risk  (02/25/2024)   Received from [facility]   Overall Financial Resource Strain (CARDIA)    Difficulty of Paying Living Expenses: Not very hard  Food Insecurity: No Food Insecurity (02/25/2024)   Received from [facility]   Epic    Within the past 12 months, you worried that your food would run out before you got the money to buy more.: Never true    Within the past 12 months, the food you  bought just didn't last and you didn't have money to get more.: Never true  Transportation Needs: Unknown (02/25/2024)   Received from [facility]    In the past 12 months, has lack of transportation kept you from medical appointments or from getting medications?: No    Lack of Transportation (Non-Medical): Not on file  Physical Activity: Not on file  Stress: Not on file  Social Connections: Not on file  Intimate Partner Violence: Not on file  Depression (EYV7-0): Not on file  Alcohol Screen: Not on file  Housing: Low Risk  (02/25/2024)   Received from [facility]   Epic    In the last 12 months, was there a time when you were not able to pay the mortgage or rent on time?: No    In the past 12 months, how many times have you moved where you were living?: 0    At any time in the past 12 months, were you homeless or living in a shelter (including now)?: No  Utilities:  Not At Risk (02/25/2024)   Received from [facility]   Epic    In the past 12 months has the electric, gas, oil, or water  company threatened to shut off services in your home?: No  Health Literacy: Not on file    ROS See HPI.   Objective    BP 122/70   Pulse 78   Wt 128 lb 12 oz (58.4 kg)   SpO2 100%   BMI 23.55 kg/m   Physical Exam   Assessment & Plan:  .Shatiqua was seen today for establish care.  Diagnoses and all orders for this visit:  Encounter to establish care  Post-surgical hypothyroidism  Papillary thyroid  carcinoma (HCC)  GAD (generalized anxiety disorder) -     vortioxetine  HBr (TRINTELLIX ) 5 MG TABS tablet; Take 1 tablet (5 mg total) by mouth daily. -     clonazePAM  (KLONOPIN ) 0.5 MG tablet; Take 1 tablet (0.5 mg total) by mouth 2 (two) times daily as needed for anxiety.  Moderate episode of recurrent major depressive disorder (HCC) -     vortioxetine  HBr (TRINTELLIX ) 5 MG TABS tablet; Take 1 tablet (5 mg total)  by mouth daily.   Assessment & Plan Generalized anxiety disorder Chronic anxiety exacerbated by T3 discontinuation. Previous SSRI and Wellbutrin  trials ineffective. Symptoms indicate anxiety predominance. Discussed Trintellix  and Klonopin  benefits. Emphasized support system and therapy. - Prescribed Trintellix  5 mg daily. - Prescribed Klonopin  up to twice a day as needed for acute anxiety. - Ordered gene site testing for future medication guidance.  Major depressive disorder, recurrent, moderate Recurrent depression with exacerbation. Previous Prozac, Wellbutrin , and Zoloft  trials ineffective. Symptoms include passive depression and mood swings. Discussed Trintellix  benefits for activation without previous side effects. - Prescribed Trintellix  5 mg daily.  Papillary thyroid  carcinoma Post neck dissection with no metastasis beyond neck. Awaiting labs and ultrasound for further evaluation. Discussed potential need for radioactive iodine or radiation therapy. Emphasized monitoring thyroid  levels and distinguishing scar tissue from residual cancer. - Repeated labs on Monday. - Scheduled neck ultrasound on February 27th at [facility]. - Will discuss potential treatment options based on ultrasound and lab results.  Post-surgical hypothyroidism Managed with Synthroid . Adjusting thyroid  levels for optimal control. Discussed complexity due to metabolic role. - Continue Synthroid  with ongoing dose adjustments as needed.     Return for 4-6 weeks for F/U on depression/anxiety.   Joniece Smotherman, PA-C   "

## 2024-08-14 ENCOUNTER — Other Ambulatory Visit (HOSPITAL_COMMUNITY): Payer: Self-pay

## 2024-08-14 ENCOUNTER — Encounter: Payer: Self-pay | Admitting: Physician Assistant

## 2024-08-14 DIAGNOSIS — R4589 Other symptoms and signs involving emotional state: Secondary | ICD-10-CM | POA: Insufficient documentation

## 2024-08-14 NOTE — Telephone Encounter (Signed)
 Spoke with from [place] from [facility]. Patients clonazepam  is ready for pickup but her vortioxetine  5 mg needs a PA.

## 2024-08-14 NOTE — Telephone Encounter (Signed)
 Pharmacy Patient Advocate Encounter   Received notification from Physician's Office that prior authorization for TRINTELLIX  is required/requested.   Insurance verification completed.   The patient is insured through [facility].   Per test claim: PA required; PA started via CoverMyMeds. KEY [id number] . Waiting for clinical questions to populate.

## 2024-09-15 ENCOUNTER — Ambulatory Visit: Admitting: Physician Assistant
